# Patient Record
Sex: Female | Born: 2000 | Race: Black or African American | Hispanic: No | Marital: Single | Smoking: Never smoker
Health system: Southern US, Community
[De-identification: ages and names within clinical notes are randomized; demographics above are authoritative.]

## PROBLEM LIST (undated history)

## (undated) DIAGNOSIS — L309 Dermatitis, unspecified: Secondary | ICD-10-CM

## (undated) DIAGNOSIS — I10 Essential (primary) hypertension: Secondary | ICD-10-CM

## (undated) DIAGNOSIS — J45909 Unspecified asthma, uncomplicated: Secondary | ICD-10-CM

## (undated) HISTORY — PX: WISDOM TOOTH EXTRACTION: SHX21

---
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
## 2018-01-31 IMAGING — CR DG CHEST 2V
2 series · 2 of 2 positions shown · non-contrast
Comparison: None.

CLINICAL DATA: Cough, congestion and slight fever with chest pain.

EXAM:
CHEST  2 VIEW

[w chest pa *]
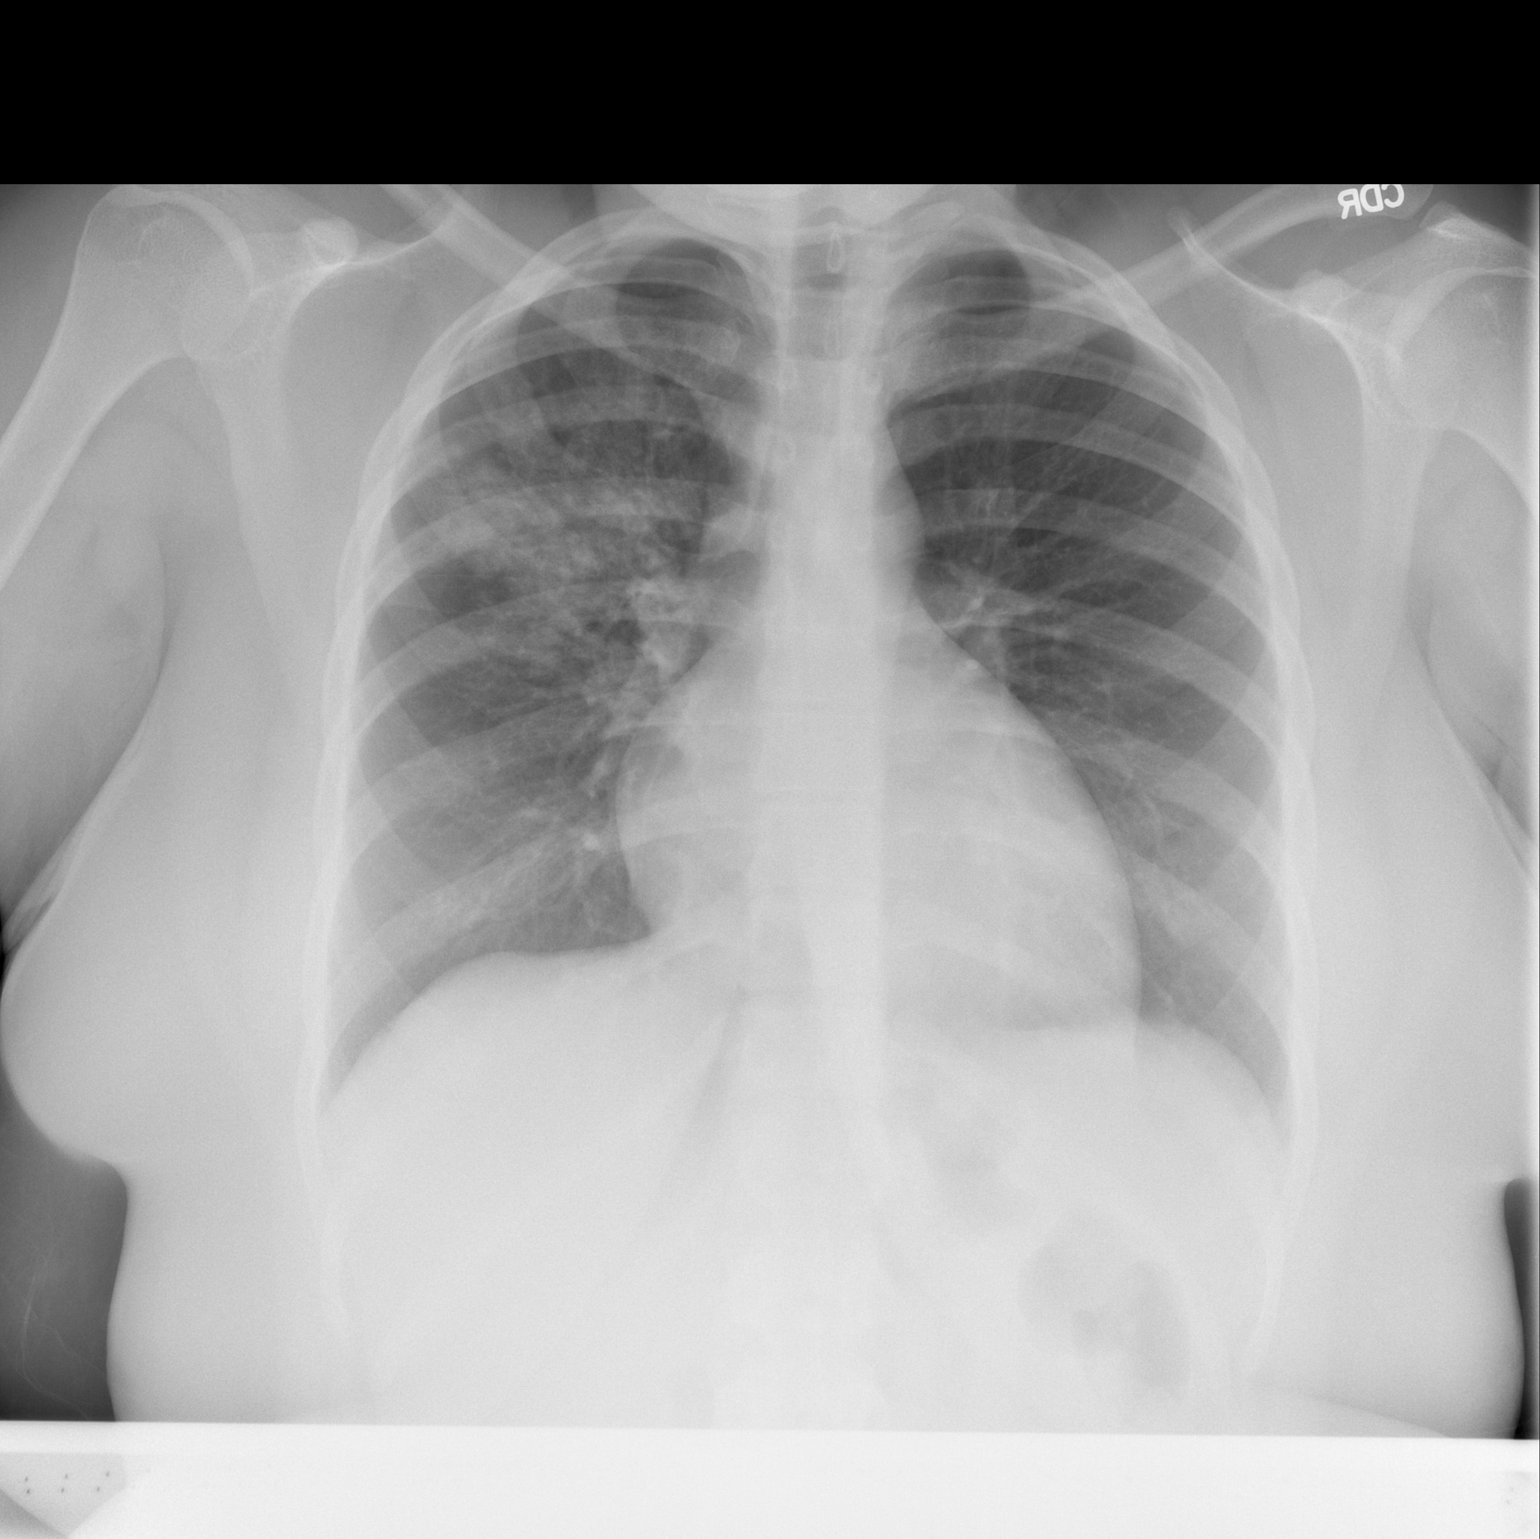

[w chest lat *]
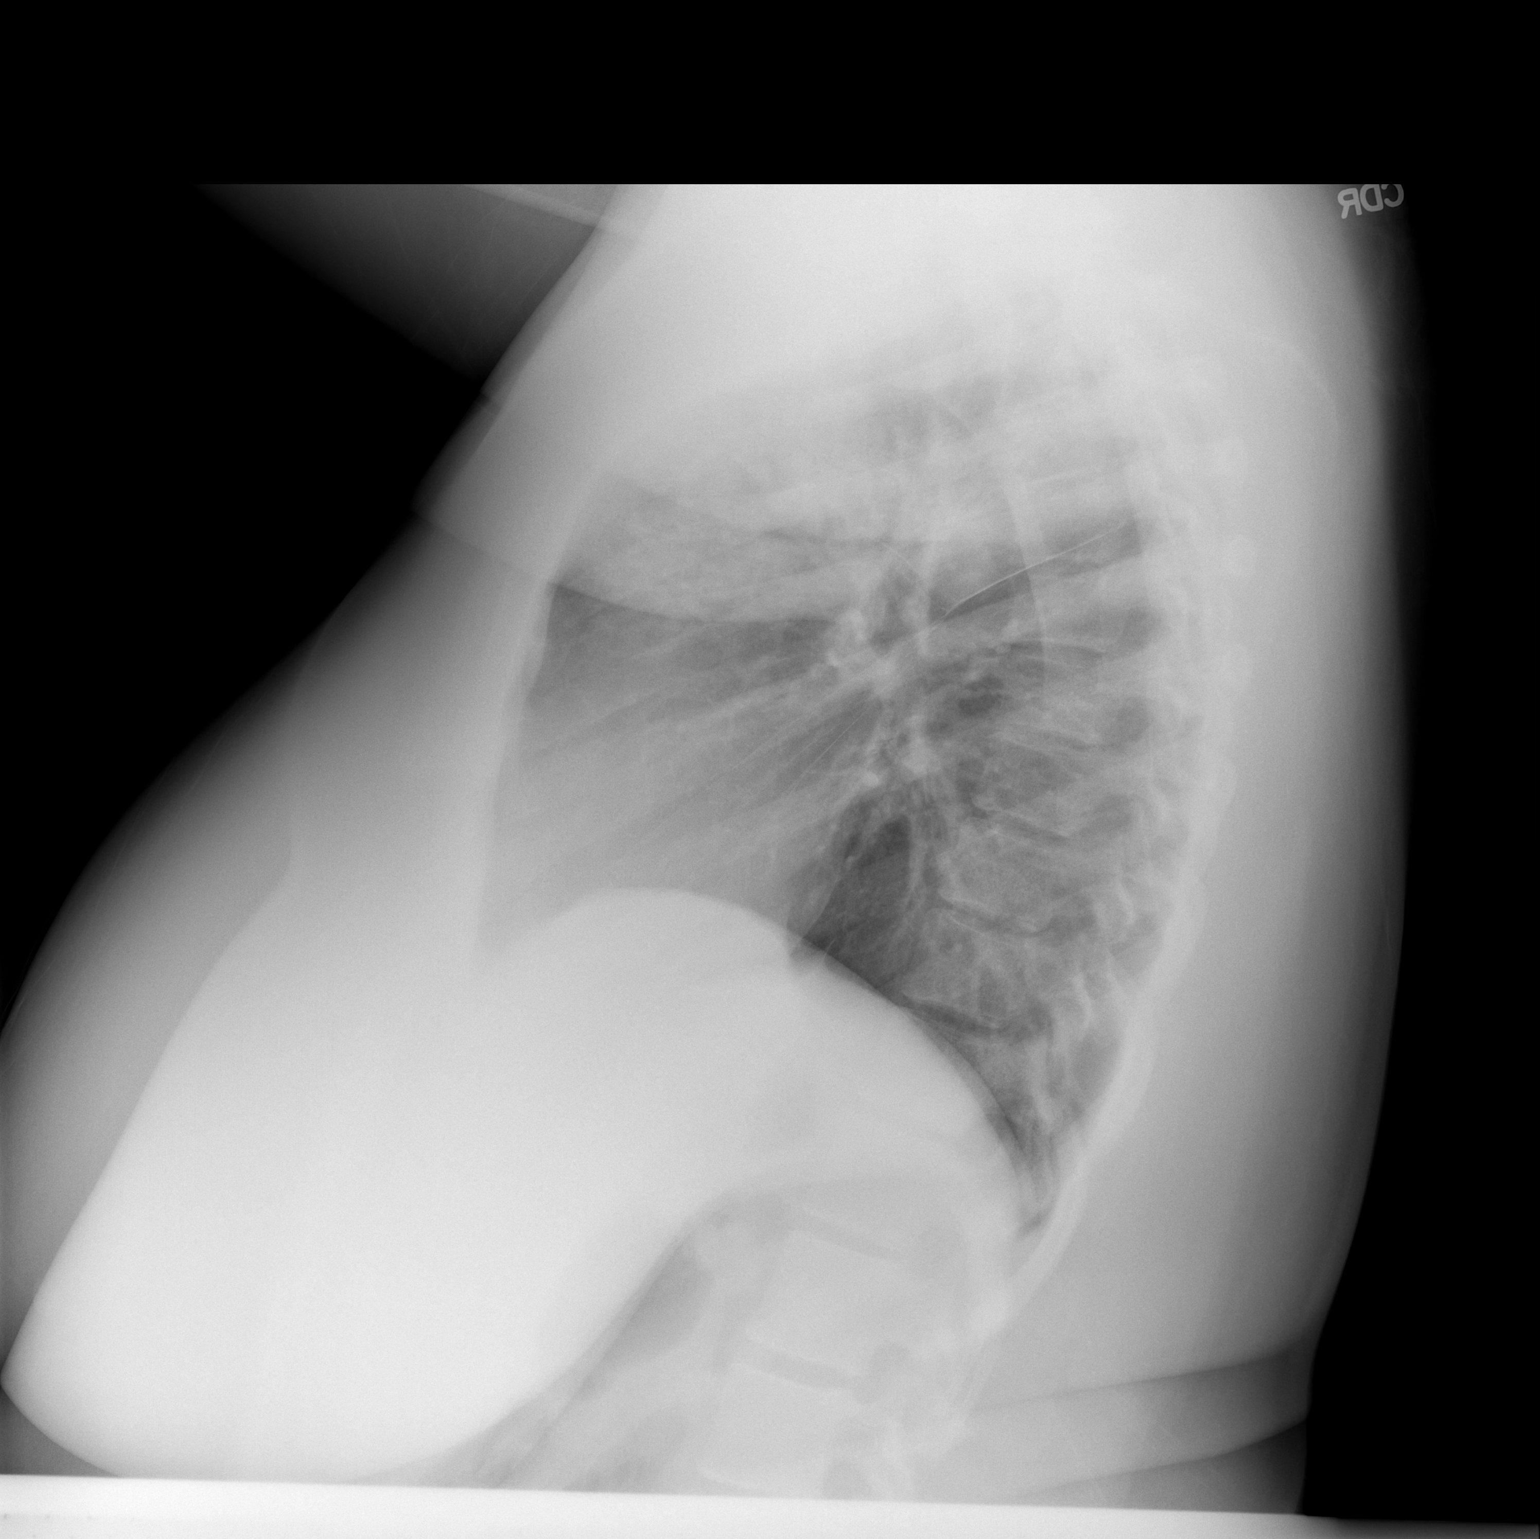

[2 of 2 positions shown; findings below may reference images not displayed]

FINDINGS: Pneumonic consolidation in the right upper lobe is noted. The heart
is top-normal in size. The left lung is clear. No suspicious osseous
lesions. No aortic aneurysm. No effusion or pneumothorax.
IMPRESSION: Right upper lobe pneumonia.

## 2018-02-10 ENCOUNTER — Encounter (HOSPITAL_BASED_OUTPATIENT_CLINIC_OR_DEPARTMENT_OTHER): Payer: Self-pay | Admitting: *Deleted

## 2018-02-10 ENCOUNTER — Other Ambulatory Visit: Payer: Self-pay

## 2018-02-10 ENCOUNTER — Emergency Department (HOSPITAL_BASED_OUTPATIENT_CLINIC_OR_DEPARTMENT_OTHER)
Admission: EM | Admit: 2018-02-10 | Discharge: 2018-02-10 | Disposition: A | Discharge status: Home / Self Care | Payer: Medicaid Other | Attending: Emergency Medicine | Admitting: Emergency Medicine

## 2018-02-10 DIAGNOSIS — J45909 Unspecified asthma, uncomplicated: Secondary | ICD-10-CM | POA: Insufficient documentation

## 2018-02-10 DIAGNOSIS — Z7722 Contact with and (suspected) exposure to environmental tobacco smoke (acute) (chronic): Secondary | ICD-10-CM | POA: Insufficient documentation

## 2018-02-10 DIAGNOSIS — I10 Essential (primary) hypertension: Secondary | ICD-10-CM | POA: Diagnosis not present

## 2018-02-10 DIAGNOSIS — R600 Localized edema: Secondary | ICD-10-CM | POA: Diagnosis not present

## 2018-02-10 DIAGNOSIS — Z91010 Allergy to peanuts: Secondary | ICD-10-CM | POA: Insufficient documentation

## 2018-02-10 LAB — PREGNANCY, URINE: Preg Test, Ur: NEGATIVE

## 2018-02-10 NOTE — ED Provider Notes (Signed)
[facility] EMERGENCY DEPARTMENT Provider Note   CSN: [id number] Arrival date & time: 02/10/18  1130     History   Chief Complaint Chief Complaint  Patient presents with  . Joint Swelling    HPI Cathy Todd is a 17 y.o. female.  Patient presents with complaint of bilateral lower extremity swelling occurring since March of this year.  Patient complains of pain in her ankles and feet when the swelling is worse.  She reports working long hours on her feet and symptoms are worse after she works.  If she has a couple days off or has her feet propped up at night, the symptoms improved.  Patient denies any history of liver, kidney, heart problems.  She does eat a lot of salt in her diet.  No swelling elsewhere.  No fevers, nausea, vomiting.  No change in the urine.  No treatments prior to arrival.     Past Medical History:  Diagnosis Date  . Asthma   . Eczema   . Hypertension     There are no active problems to display for this patient.   History reviewed. No pertinent surgical history.   OB History   None      Home Medications    Prior to Admission medications   Medication Sig Start Date End Date Taking? Authorizing Provider  DESONIDE EX Apply topically.    [provider]    Family History Family History  Problem Relation Age of Onset  . Hypertension Other   . Stroke Other   . Heart disease Other   . Diabetes Other     Social History Social History   Tobacco Use  . Smoking status: Passive Smoke Exposure - Never Smoker  . Smokeless tobacco: Never Used  Substance Use Topics  . Alcohol use: No  . Drug use: No     Allergies   Fish allergy; Peanut-containing drug products; and Shellfish allergy   Review of Systems Review of Systems  Constitutional: Negative for fever.  HENT: Negative for rhinorrhea and sore throat.   Eyes: Negative for redness.  Respiratory: Negative for cough.   Cardiovascular: Positive for leg swelling.  Negative for chest pain.  Gastrointestinal: Negative for abdominal pain, diarrhea, nausea and vomiting.  Genitourinary: Negative for dysuria.  Musculoskeletal: Negative for myalgias.  Skin: Negative for rash.  Neurological: Negative for headaches.     Physical Exam Updated Vital Signs BP 121/73   Pulse (!) 101   Temp 98.1 F (36.7 C) (Oral)   Resp 18   Ht 5\' 4"  (1.626 m)   Wt 127 kg (280 lb)   SpO2 100%   BMI 48.06 kg/m   Physical Exam  Constitutional: She appears well-developed and well-nourished.  HENT:  Head: Normocephalic and atraumatic.  Eyes: Conjunctivae are normal.  Neck: Normal range of motion. Neck supple.  Pulmonary/Chest: No respiratory distress.  Musculoskeletal: She exhibits edema and tenderness.  Patient with symmetric 1+ pitting edema to the ankles and feet bilaterally.  No erythema or redness to suggest cellulitis.  No significant tenderness to suggest DVT.  Neurological: She is alert.  Skin: Skin is warm and dry.  Psychiatric: She has a normal mood and affect.  Nursing note and vitals reviewed.    ED Treatments / Results  Labs (all labs ordered are listed, but only abnormal results are displayed) Labs Reviewed  PREGNANCY, URINE    EKG None  Radiology No results found.  Procedures Procedures (including critical care time)  Medications Ordered  in ED Medications - No data to display   Initial Impression / Assessment and Plan / ED Course  I have reviewed the triage vital signs and the nursing notes.  Pertinent labs & imaging results that were available during my care of the patient were reviewed by me and considered in my medical decision making (see chart for details).     Patient seen and examined.  Patient with a history and exam consistent with dependent edema.  Vital signs reviewed and are as follows: BP 121/73   Pulse (!) 101   Temp 98.1 F (36.7 C) (Oral)   Resp 18   Ht 5\' 4"  (1.626 m)   Wt 127 kg (280 lb)   SpO2 100%    BMI 48.06 kg/m   I have encouraged patient to go to a medical supply store and obtain a pair of fitted compression stockings.  We also discussed decreasing salt level in her diet and elevating legs when possible.  Patient verbalizes understanding agrees with plan.  Final Clinical Impressions(s) / ED Diagnoses   Final diagnoses:  Bilateral lower extremity edema   Patient with bilateral dependent edema.  Low concern for liver issue, heart failure, nephrotic syndrome, DVTs bilaterally at this point.  Do not feel that further work-up is indicated at this point.  Discussed plan as above.  ED Discharge Orders    None       Renne CriglerGeiple, Chesnee Floren, Cordelia Poche-C 02/10/18 1430    Loren RacerYelverton, David, MD 02/10/18 [phone]

## 2018-02-10 NOTE — ED Notes (Signed)
ED Provider at bedside. 

## 2018-02-10 NOTE — ED Triage Notes (Signed)
Ankles have been swelling for 2 months. States she stands for long hours at work.

## 2018-02-10 NOTE — Discharge Instructions (Signed)
Please read and follow all provided instructions.  Your diagnoses today include:  1. Bilateral lower extremity edema    Tests performed today include:  Vital signs. See below for your results today.   Medications prescribed:   None  Take any prescribed medications only as directed.  Home care instructions:  Follow any educational materials contained in this packet.  Please go to a medical supply store and get a pair of fitted compression stockings to wear while you are at work.   Follow-up instructions: Please follow-up with your primary care provider in the next 3 days for further evaluation of your symptoms.   Return instructions:   Please return to the Emergency Department if you experience worsening symptoms.   Please return if you have any other emergent concerns.  Additional Information:  Your vital signs today were: BP 121/73    Pulse (!) 101    Temp 98.1 F (36.7 C) (Oral)    Resp 18    Ht 5\' 4"  (1.626 m)    Wt 127 kg (280 lb)    SpO2 100%    BMI 48.06 kg/m  If your blood pressure (BP) was elevated above 135/85 this visit, please have this repeated by your doctor within one month. --------------

## 2019-04-20 ENCOUNTER — Other Ambulatory Visit: Payer: Self-pay

## 2019-04-20 ENCOUNTER — Encounter (HOSPITAL_BASED_OUTPATIENT_CLINIC_OR_DEPARTMENT_OTHER): Payer: Self-pay | Admitting: Emergency Medicine

## 2019-04-20 ENCOUNTER — Emergency Department (HOSPITAL_BASED_OUTPATIENT_CLINIC_OR_DEPARTMENT_OTHER)
Admission: EM | Admit: 2019-04-20 | Discharge: 2019-04-20 | Disposition: A | Discharge status: Home / Self Care | Payer: Medicaid Other | Attending: Emergency Medicine | Admitting: Emergency Medicine

## 2019-04-20 DIAGNOSIS — N3000 Acute cystitis without hematuria: Secondary | ICD-10-CM | POA: Insufficient documentation

## 2019-04-20 DIAGNOSIS — I10 Essential (primary) hypertension: Secondary | ICD-10-CM | POA: Insufficient documentation

## 2019-04-20 DIAGNOSIS — Z113 Encounter for screening for infections with a predominantly sexual mode of transmission: Secondary | ICD-10-CM | POA: Diagnosis not present

## 2019-04-20 DIAGNOSIS — R1110 Vomiting, unspecified: Secondary | ICD-10-CM | POA: Diagnosis present

## 2019-04-20 DIAGNOSIS — J45909 Unspecified asthma, uncomplicated: Secondary | ICD-10-CM | POA: Diagnosis not present

## 2019-04-20 DIAGNOSIS — R112 Nausea with vomiting, unspecified: Secondary | ICD-10-CM

## 2019-04-20 LAB — URINALYSIS, ROUTINE W REFLEX MICROSCOPIC
Bilirubin Urine: NEGATIVE
Glucose, UA: NEGATIVE mg/dL
Ketones, ur: NEGATIVE mg/dL
Nitrite: NEGATIVE
Protein, ur: 30 mg/dL — AB
Specific Gravity, Urine: >1.03 — ABNORMAL HIGH (ref 1.005–1.030)
pH: 6 (ref 5.0–8.0)

## 2019-04-20 LAB — PREGNANCY, URINE: Preg Test, Ur: NEGATIVE

## 2019-04-20 LAB — URINALYSIS, MICROSCOPIC (REFLEX): WBC, UA: >50 WBC/hpf (ref 0–5)

## 2019-04-20 LAB — WET PREP, GENITAL
Sperm: NONE SEEN
Trich, Wet Prep: NONE SEEN
Yeast Wet Prep HPF POC: NONE SEEN

## 2019-04-20 MED ORDER — CEPHALEXIN 500 MG PO CAPS: 500.0000 mg | ORAL_CAPSULE | Freq: Three times a day (TID) | ORAL | 0 refills | Status: DC

## 2019-04-20 MED FILL — CEPHALEXIN 500 MG CAPSULE: 500 | 5 days supply | Qty: 15 | Fill #0

## 2019-04-20 NOTE — ED Notes (Signed)
ED Provider at bedside. 

## 2019-04-20 NOTE — Discharge Instructions (Addendum)
Begin taking Keflex as prescribed.  We will call you if your cultures indicate you require further treatment or need to take additional action. 

## 2019-04-20 NOTE — ED Provider Notes (Signed)
[place] EMERGENCY DEPARTMENT Provider Note   CSN: [id number] Arrival date & time: 04/20/19  0731     History   Chief Complaint Chief Complaint  Patient presents with  . work note    HPI Cathy Todd is a 18 y.o. female.     Patient is a 18 year old female with history of asthma presenting with complaints of vomiting.  According to the patient she went to work this morning and vomited in the bathroom.  Her boss heard her vomit and told her she must come to the ER to be checked for COVID.  Patient denies any cough, fevers, or chills.  She denies any abdominal pain.    Patient would also like to be checked for STDs while she is here.  She is not having any discharge or vaginal pain, however states that she is concerned the person she is having sex with is promiscuous.  The history is provided by the patient.    Past Medical History:  Diagnosis Date  . Asthma   . Eczema   . Hypertension     There are no active problems to display for this patient.   Past Surgical History:  Procedure Laterality Date  . WISDOM TOOTH EXTRACTION       OB History   No obstetric history on file.      Home Medications    Prior to Admission medications   Not on File    Family History Family History  Problem Relation Age of Onset  . Hypertension Other   . Stroke Other   . Heart disease Other   . Diabetes Other     Social History Social History   Tobacco Use  . Smoking status: Never Smoker  . Smokeless tobacco: Never Used  Substance Use Topics  . Alcohol use: No    Comment: occ  . Drug use: Yes    Types: Marijuana    Comment: Last smoked last night     Allergies   Fish allergy, Peanut-containing drug products, and Shellfish allergy   Review of Systems Review of Systems  All other systems reviewed and are negative.    Physical Exam Updated Vital Signs BP 130/84 (BP Location: Right Arm)   Pulse 71   Temp 98.4 F (36.9 C) (Oral)   Resp 16    Ht 5\' 4"  (1.626 m)   Wt 94.6 kg   LMP 03/24/2019   SpO2 100%   BMI 35.81 kg/m   Physical Exam Vitals signs and nursing note reviewed.  Constitutional:      General: She is not in acute distress.    Appearance: She is well-developed. She is not diaphoretic.  HENT:     Head: Normocephalic and atraumatic.  Neck:     Musculoskeletal: Normal range of motion and neck supple.  Cardiovascular:     Rate and Rhythm: Normal rate and regular rhythm.     Heart sounds: No murmur. No friction rub. No gallop.   Pulmonary:     Effort: Pulmonary effort is normal. No respiratory distress.     Breath sounds: Normal breath sounds. No wheezing.  Abdominal:     General: Bowel sounds are normal. There is no distension.     Palpations: Abdomen is soft.     Tenderness: There is no abdominal tenderness.  Genitourinary:    General: Normal vulva.     Comments: Pelvic exam performed with chaperone present, RN Ilda Basset.  External genitalia is unremarkable in appearance.  There is  a slight whitish discharge present.   Musculoskeletal: Normal range of motion.  Skin:    General: Skin is warm and dry.  Neurological:     Mental Status: She is alert and oriented to person, place, and time.      ED Treatments / Results  Labs (all labs ordered are listed, but only abnormal results are displayed) Labs Reviewed  WET PREP, GENITAL  URINALYSIS, ROUTINE W REFLEX MICROSCOPIC  PREGNANCY, URINE  GC/CHLAMYDIA PROBE AMP ([place] NOT [facility]    EKG None  Radiology No results found.  Procedures Procedures (including critical care time)  Medications Ordered in ED Medications - No data to display   Initial Impression / Assessment and Plan / ED Course  I have reviewed the triage vital signs and the nursing notes.  Pertinent labs & imaging results that were available during my care of the patient were reviewed by me and considered in my medical decision making (see chart for details).  Patient  presenting here with complaints of vomiting.  She was told to come here by her employer because she vomited it at work.  This in no way sounds like COVID.  Her abdomen is benign and she is nontoxic-appearing.  A urine pregnancy test and urinalysis was obtained which shows evidence for UTI.  The pregnancy test is negative.  Patient also requesting STD screening.  A wet prep was obtained showing many white cells.  GC and chlamydia pending.  Patient will be given antibiotics for her UTI and discharged.  If her cultures are positive, she will be notified.  As she is not having any symptoms, I do not feel as though presumptive treatment is indicated.  Final Clinical Impressions(s) / ED Diagnoses   Final diagnoses:  None    ED Discharge Orders    None       Geoffery Lyonselo, Geena Weinhold, MD 04/20/19 [phone]

## 2019-04-20 NOTE — ED Triage Notes (Signed)
Pt vomited at work this morning and her boss heard her in the bathroom vomiting and made her come to the doctor and get a note before she can come back.  Sts she vomited once at home this morning and once at work.  Also intermittent dizziness although she drove to work and drove to ED. Minimal abd discomfort.

## 2019-04-21 LAB — GC/CHLAMYDIA PROBE AMP (~~LOC~~) NOT AT ARMC
Chlamydia: POSITIVE — AB
Neisseria Gonorrhea: NEGATIVE

## 2019-04-22 ENCOUNTER — Telehealth: Payer: Self-pay | Admitting: Medical

## 2019-04-22 DIAGNOSIS — A749 Chlamydial infection, unspecified: Secondary | ICD-10-CM

## 2019-04-22 MED ORDER — AZITHROMYCIN 250 MG PO TABS: 1000.0000 mg | ORAL_TABLET | Freq: Once | ORAL | 0 refills | Status: AC

## 2019-04-22 NOTE — Telephone Encounter (Addendum)
[facility] tested positive for  Chlamydia. Patient was called by RN and allergies and pharmacy confirmed. Rx sent to pharmacy of choice.   Luvenia Redden, PA-C 04/22/2019 3:44 PM       ----- Message from Bjorn Loser, RN sent at 04/22/2019  3:12 PM EDT ----- This patient tested positive for :  Chlamydia  She "is allergic to Peanuts and Shellfish,"  I have informed the patient of her results and confirmed her pharmacy is correct in her chart. Please send Rx.   Thank you,   Bjorn Loser, RN   Results faxed to [facility].

## 2019-05-24 ENCOUNTER — Emergency Department (HOSPITAL_BASED_OUTPATIENT_CLINIC_OR_DEPARTMENT_OTHER)
Admission: EM | Admit: 2019-05-24 | Discharge: 2019-05-24 | Disposition: A | Discharge status: Home / Self Care | Payer: Medicaid Other | Attending: Emergency Medicine | Admitting: Emergency Medicine

## 2019-05-24 ENCOUNTER — Encounter (HOSPITAL_BASED_OUTPATIENT_CLINIC_OR_DEPARTMENT_OTHER): Payer: Self-pay | Admitting: Emergency Medicine

## 2019-05-24 ENCOUNTER — Other Ambulatory Visit: Payer: Self-pay

## 2019-05-24 DIAGNOSIS — J45909 Unspecified asthma, uncomplicated: Secondary | ICD-10-CM | POA: Diagnosis not present

## 2019-05-24 DIAGNOSIS — Z79899 Other long term (current) drug therapy: Secondary | ICD-10-CM | POA: Diagnosis not present

## 2019-05-24 DIAGNOSIS — N760 Acute vaginitis: Secondary | ICD-10-CM | POA: Diagnosis not present

## 2019-05-24 DIAGNOSIS — R102 Pelvic and perineal pain: Secondary | ICD-10-CM | POA: Diagnosis present

## 2019-05-24 DIAGNOSIS — I10 Essential (primary) hypertension: Secondary | ICD-10-CM | POA: Insufficient documentation

## 2019-05-24 LAB — WET PREP, GENITAL
Clue Cells Wet Prep HPF POC: NONE SEEN
Sperm: NONE SEEN
Trich, Wet Prep: NONE SEEN
Yeast Wet Prep HPF POC: NONE SEEN

## 2019-05-24 LAB — URINALYSIS, ROUTINE W REFLEX MICROSCOPIC
Bilirubin Urine: NEGATIVE
Glucose, UA: NEGATIVE mg/dL
Hgb urine dipstick: NEGATIVE
Ketones, ur: NEGATIVE mg/dL
Nitrite: NEGATIVE
Protein, ur: 30 mg/dL — AB
Specific Gravity, Urine: >1.03 — ABNORMAL HIGH (ref 1.005–1.030)
pH: 6 (ref 5.0–8.0)

## 2019-05-24 LAB — PREGNANCY, URINE: Preg Test, Ur: NEGATIVE

## 2019-05-24 LAB — URINALYSIS, MICROSCOPIC (REFLEX): WBC, UA: >50 WBC/hpf (ref 0–5)

## 2019-05-24 NOTE — ED Triage Notes (Signed)
Pt states she is having vaginal irritation since last Thursday.  Some yellow discharge.  No itching.  No fever.  Pt tried hydrocortisone cream but no improvement.  No blisters.

## 2019-05-24 NOTE — ED Provider Notes (Signed)
[facility] EMERGENCY DEPARTMENT Provider Note   CSN: [id number] Arrival date & time: 05/24/19  0802     History   Chief Complaint Chief Complaint  Patient presents with  . Vaginal Pain    HPI Clemmie KrillZakiya Deveney is a 18 y.o. female.     HPI  18 year old female presents with vaginal irritation.  Started about 4 days ago.  She did recently use a scented toilet paper which he thinks might of caused this.  She has eczema so she tried hydrocortisone cream but after trying this it started causing burning.  She stopped it.  There is some yellow discharge.  No bleeding.  She has not had intercourse in a while and does not think she has an STI.  No dysuria or abdominal pain.  It is more irritating than painful.  Past Medical History:  Diagnosis Date  . Asthma   . Eczema   . Hypertension     There are no active problems to display for this patient.   Past Surgical History:  Procedure Laterality Date  . WISDOM TOOTH EXTRACTION       OB History   No obstetric history on file.      Home Medications    Prior to Admission medications   Medication Sig Start Date End Date Taking? Authorizing Provider  cephALEXin (KEFLEX) 500 MG capsule Take 1 capsule (500 mg total) by mouth 3 (three) times daily. 04/20/19   Geoffery Lyonselo, Douglas, MD    Family History Family History  Problem Relation Age of Onset  . Hypertension Other   . Stroke Other   . Heart disease Other   . Diabetes Other     Social History Social History   Tobacco Use  . Smoking status: Never Smoker  . Smokeless tobacco: Never Used  Substance Use Topics  . Alcohol use: No    Comment: occ  . Drug use: Yes    Types: Marijuana    Comment: Last smoked last night     Allergies   Fish allergy, Peanut oil, Peanut-containing drug products, and Shellfish allergy   Review of Systems Review of Systems  Gastrointestinal: Negative for abdominal pain.  Genitourinary: Positive for vaginal discharge and vaginal  pain. Negative for dysuria.  All other systems reviewed and are negative.    Physical Exam Updated Vital Signs BP 140/78   Pulse 70   Temp (!) 97.4 F (36.3 C) (Oral)   Resp 16   Ht 5\' 4"  (1.626 m)   Wt 91.6 kg   LMP 04/24/2019   SpO2 98%   BMI 34.67 kg/m   Physical Exam Vitals signs and nursing note reviewed.  Constitutional:      Appearance: She is well-developed.  HENT:     Head: Normocephalic and atraumatic.     Right Ear: External ear normal.     Left Ear: External ear normal.     Nose: Nose normal.  Eyes:     General:        Right eye: No discharge.        Left eye: No discharge.  Cardiovascular:     Rate and Rhythm: Normal rate and regular rhythm.     Heart sounds: Normal heart sounds.  Pulmonary:     Effort: Pulmonary effort is normal.     Breath sounds: Normal breath sounds.  Abdominal:     General: There is no distension.     Palpations: Abdomen is soft.     Tenderness: There is no  abdominal tenderness.  Genitourinary:    Vagina: Vaginal discharge present.     Comments: Bimanual exam deferred Skin:    General: Skin is warm and dry.  Neurological:     Mental Status: She is alert.  Psychiatric:        Mood and Affect: Mood is not anxious.      ED Treatments / Results  Labs (all labs ordered are listed, but only abnormal results are displayed) Labs Reviewed  WET PREP, GENITAL - Abnormal; Notable for the following components:      Result Value   WBC, Wet Prep HPF POC MANY (*)    All other components within normal limits  URINALYSIS, ROUTINE W REFLEX MICROSCOPIC - Abnormal; Notable for the following components:   APPearance HAZY (*)    Specific Gravity, Urine >1.030 (*)    Protein, ur 30 (*)    Leukocytes,Ua SMALL (*)    All other components within normal limits  URINALYSIS, MICROSCOPIC (REFLEX) - Abnormal; Notable for the following components:   Bacteria, UA MANY (*)    All other components within normal limits  PREGNANCY, URINE   GC/CHLAMYDIA PROBE AMP ([place]) NOT AT [facility]    EKG None  Radiology No results found.  Procedures Procedures (including critical care time)  Medications Ordered in ED Medications - No data to display   Initial Impression / Assessment and Plan / ED Course  I have reviewed the triage vital signs and the nursing notes.  Pertinent labs & imaging results that were available during my care of the patient were reviewed by me and considered in my medical decision making (see chart for details).        My suspicion for STI/infection is low.  She denies any urinary complaints, urine sent by nurse however and is contaminated.  I do not think this represents UTI.  Otherwise, no clear infection such as yeast, trichomonas, clue cells.  Likely inflammatory/allergic.  If she had not already tried steroids I would probably prescribe these but given the adverse reaction to the hydrocortisone, will recommend good hygiene and sits baths.  Follow-up with OB/GYN.  Final Clinical Impressions(s) / ED Diagnoses   Final diagnoses:  Acute vaginitis    ED Discharge Orders    None       Sherwood Gambler, MD 05/24/19 1010

## 2019-05-25 LAB — GC/CHLAMYDIA PROBE AMP (~~LOC~~) NOT AT ARMC
Chlamydia: NEGATIVE
Neisseria Gonorrhea: NEGATIVE

## 2019-07-11 ENCOUNTER — Emergency Department (HOSPITAL_BASED_OUTPATIENT_CLINIC_OR_DEPARTMENT_OTHER)
Admission: EM | Admit: 2019-07-11 | Discharge: 2019-07-12 | Disposition: A | Discharge status: Home / Self Care | Payer: Medicaid Other | Attending: Emergency Medicine | Admitting: Emergency Medicine

## 2019-07-11 ENCOUNTER — Encounter (HOSPITAL_BASED_OUTPATIENT_CLINIC_OR_DEPARTMENT_OTHER): Payer: Self-pay | Admitting: Emergency Medicine

## 2019-07-11 ENCOUNTER — Other Ambulatory Visit: Payer: Self-pay

## 2019-07-11 DIAGNOSIS — Z79899 Other long term (current) drug therapy: Secondary | ICD-10-CM | POA: Insufficient documentation

## 2019-07-11 DIAGNOSIS — Z91010 Allergy to peanuts: Secondary | ICD-10-CM | POA: Insufficient documentation

## 2019-07-11 DIAGNOSIS — Z3201 Encounter for pregnancy test, result positive: Secondary | ICD-10-CM | POA: Diagnosis not present

## 2019-07-11 DIAGNOSIS — O200 Threatened abortion: Secondary | ICD-10-CM | POA: Insufficient documentation

## 2019-07-11 DIAGNOSIS — J45909 Unspecified asthma, uncomplicated: Secondary | ICD-10-CM | POA: Diagnosis not present

## 2019-07-11 DIAGNOSIS — I10 Essential (primary) hypertension: Secondary | ICD-10-CM | POA: Insufficient documentation

## 2019-07-11 DIAGNOSIS — N939 Abnormal uterine and vaginal bleeding, unspecified: Secondary | ICD-10-CM | POA: Diagnosis present

## 2019-07-11 LAB — PREGNANCY, URINE: Preg Test, Ur: POSITIVE — AB

## 2019-07-11 LAB — URINALYSIS, ROUTINE W REFLEX MICROSCOPIC
Bilirubin Urine: NEGATIVE
Glucose, UA: NEGATIVE mg/dL
Ketones, ur: >80 mg/dL — AB
Nitrite: NEGATIVE
Protein, ur: 30 mg/dL — AB
Specific Gravity, Urine: >1.03 — ABNORMAL HIGH (ref 1.005–1.030)
pH: 6 (ref 5.0–8.0)

## 2019-07-11 LAB — URINALYSIS, MICROSCOPIC (REFLEX)

## 2019-07-11 MED ORDER — SODIUM CHLORIDE 0.9 % IV BOLUS
1000.0000 mL | Freq: Once | INTRAVENOUS | Status: AC
  Administered 2019-07-11: 1000 mL via INTRAVENOUS

## 2019-07-11 NOTE — ED Notes (Signed)
Called lab to add-on urinalysis °

## 2019-07-11 NOTE — ED Triage Notes (Signed)
Pt requesting pregnancy test, co/ lower abdominal cramping and lower back pain, and nausea. Reports mild spotting at this time.

## 2019-07-11 NOTE — ED Provider Notes (Signed)
[place] EMERGENCY DEPARTMENT Provider Note   CSN: [id number] Arrival date & time: 07/11/19  2144     History   Chief Complaint Chief Complaint  Patient presents with  . Possible Pregnancy    HPI Cathy Todd is a 18 y.o. female.     HPI  This is a 18 year old female with a history of hypertension and asthma who presents with possible pregnancy.  Patient reports that she missed her period which was due October 25.  On Friday she noted some light spotting.  She has not had to use a pad but only notices when she wipes.  She also reports some back pain.  She has developed some nausea.  No vomiting.  She is never been pregnant before.  Denies any abdominal pain or lateralizing pain.  Denies any dysuria or hematuria.  Past Medical History:  Diagnosis Date  . Asthma   . Eczema   . Hypertension     There are no active problems to display for this patient.   Past Surgical History:  Procedure Laterality Date  . WISDOM TOOTH EXTRACTION       OB History   No obstetric history on file.      Home Medications    Prior to Admission medications   Medication Sig Start Date End Date Taking? Authorizing Provider  cephALEXin (KEFLEX) 500 MG capsule Take 1 capsule (500 mg total) by mouth 3 (three) times daily. 04/20/19   Veryl Speak, MD    Family History Family History  Problem Relation Age of Onset  . Hypertension Other   . Stroke Other   . Heart disease Other   . Diabetes Other     Social History Social History   Tobacco Use  . Smoking status: Never Smoker  . Smokeless tobacco: Never Used  Substance Use Topics  . Alcohol use: Yes    Comment: occ  . Drug use: Yes    Types: Marijuana    Comment: Last smoked last night     Allergies   Fish allergy, Peanut oil, Peanut-containing drug products, and Shellfish allergy   Review of Systems Review of Systems  Constitutional: Negative for fever.  Gastrointestinal: Positive for nausea. Negative for  abdominal pain and vomiting.  Genitourinary: Positive for vaginal bleeding. Negative for dysuria and hematuria.  Musculoskeletal: Positive for back pain.  All other systems reviewed and are negative.    Physical Exam Updated Vital Signs BP (!) 150/99 (BP Location: Left Arm)   Temp 98.4 F (36.9 C) (Oral)   Resp 18   Ht 1.626 m (5\' 4" )   Wt 83.9 kg   LMP 06/07/2019 (Approximate)   SpO2 97%   BMI 31.76 kg/m   Physical Exam Vitals signs and nursing note reviewed.  Constitutional:      Appearance: She is well-developed.     Comments: Overweight  HENT:     Head: Normocephalic and atraumatic.     Mouth/Throat:     Mouth: Mucous membranes are moist.  Eyes:     Pupils: Pupils are equal, round, and reactive to light.  Neck:     Musculoskeletal: Neck supple.  Cardiovascular:     Rate and Rhythm: Normal rate and regular rhythm.     Heart sounds: Normal heart sounds.  Pulmonary:     Effort: Pulmonary effort is normal. No respiratory distress.     Breath sounds: No wheezing.  Abdominal:     General: Bowel sounds are normal.     Palpations: Abdomen  is soft.     Tenderness: There is no abdominal tenderness. There is no guarding or rebound.  Genitourinary:    Comments: Normal external vaginal exam, moderate amount of dark red blood in the vaginal vault, cervical os closed, no adnexal tenderness or masses Skin:    General: Skin is warm and dry.  Neurological:     Mental Status: She is alert and oriented to person, place, and time.  Psychiatric:        Mood and Affect: Mood normal.      ED Treatments / Results  Labs (all labs ordered are listed, but only abnormal results are displayed) Labs Reviewed  PREGNANCY, URINE - Abnormal; Notable for the following components:      Result Value   Preg Test, Ur POSITIVE (*)    All other components within normal limits  URINALYSIS, ROUTINE W REFLEX MICROSCOPIC - Abnormal; Notable for the following components:   Color, Urine ORANGE  (*)    APPearance CLOUDY (*)    Specific Gravity, Urine >1.030 (*)    Hgb urine dipstick LARGE (*)    Ketones, ur >80 (*)    Protein, ur 30 (*)    Leukocytes,Ua TRACE (*)    All other components within normal limits  HCG, QUANTITATIVE, PREGNANCY - Abnormal; Notable for the following components:   hCG, Beta Chain, Quant, S 362 (*)    All other components within normal limits  URINALYSIS, MICROSCOPIC (REFLEX) - Abnormal; Notable for the following components:   Bacteria, UA MANY (*)    All other components within normal limits  WET PREP, GENITAL  ABO/RH  GC/CHLAMYDIA PROBE AMP ([place]) NOT AT [facility]    EKG None  Radiology No results found.  Procedures Procedures (including critical care time)  Medications Ordered in ED Medications  sodium chloride 0.9 % bolus 1,000 mL (1,000 mLs Intravenous New Bag/Given 07/11/19 2359)     Initial Impression / Assessment and Plan / ED Course  I have reviewed the triage vital signs and the nursing notes.  Pertinent labs & imaging results that were available during my care of the patient were reviewed by me and considered in my medical decision making (see chart for details).        Patient presents with bleeding and late menstrual period.  She is nontoxic-appearing vital signs are reassuring.  Patient has a nontender abdomen.  Mostly complaining of back pain and nausea.  Reports that she was due to have a.  On October 25.  Unsure of when her last period was.  Urine pregnancy test is positive.  Beta-hCG 362.  She is Rh+ and not a candidate for RhoGam.  Her abdominal and GU exams are benign.  I discussed the results with the patient.  I advised her that at this time we will treat this as a threatened miscarriage.  Given that her abdominal exam is benign and her beta-hCG is 362, do not feel ultrasound imaging would be helpful at this time and I have a lower suspicious for ectopic.  However, I stressed with her that she needs repeat beta-hCG in 2  to 3 days to evaluate the viability of the pregnancy.  She is to monitor her bleeding during that time.  If she develops any new symptoms or specifically abdominal pain, she needs to proceed directly to [facility] for evaluation.  After history, exam, and medical workup I feel the patient has been appropriately medically screened and is safe for discharge home. Pertinent diagnoses were  discussed with the patient. Patient was given return precautions.   Final Clinical Impressions(s) / ED Diagnoses   Final diagnoses:  Threatened miscarriage    ED Discharge Orders    None       Horton, Mayer Maskerourtney F, MD 07/12/19 [phone]

## 2019-07-11 NOTE — ED Notes (Signed)
ED Provider at bedside. 

## 2019-07-12 LAB — ABO/RH: ABO/RH(D): B POS

## 2019-07-12 LAB — WET PREP, GENITAL
Sperm: NONE SEEN
Trich, Wet Prep: NONE SEEN
Yeast Wet Prep HPF POC: NONE SEEN

## 2019-07-12 LAB — HCG, QUANTITATIVE, PREGNANCY: hCG, Beta Chain, Quant, S: 362 m[IU]/mL — ABNORMAL HIGH (ref <5)

## 2019-07-12 NOTE — Discharge Instructions (Addendum)
You were seen today for bleeding during early pregnancy.  This could represent a early miscarriage or normal pregnancy.  Sometimes pregnancies are outside the uterus and are called ectopic pregnancies.  If you develop pain, you need to be reevaluated immediately with ultrasound imaging.  Otherwise, follow-up for recheck of your blood pregnancy levels in 2 to 3 days.

## 2019-07-13 LAB — GC/CHLAMYDIA PROBE AMP (~~LOC~~) NOT AT ARMC
Chlamydia: NEGATIVE
Neisseria Gonorrhea: NEGATIVE

## 2019-07-15 ENCOUNTER — Encounter (HOSPITAL_BASED_OUTPATIENT_CLINIC_OR_DEPARTMENT_OTHER): Payer: Self-pay

## 2019-07-15 ENCOUNTER — Emergency Department (HOSPITAL_BASED_OUTPATIENT_CLINIC_OR_DEPARTMENT_OTHER): Payer: Medicaid Other

## 2019-07-15 ENCOUNTER — Other Ambulatory Visit: Payer: Self-pay

## 2019-07-15 ENCOUNTER — Emergency Department (HOSPITAL_BASED_OUTPATIENT_CLINIC_OR_DEPARTMENT_OTHER)
Admission: EM | Admit: 2019-07-15 | Discharge: 2019-07-15 | Disposition: A | Discharge status: Home / Self Care | Payer: Medicaid Other | Attending: Emergency Medicine | Admitting: Emergency Medicine

## 2019-07-15 DIAGNOSIS — I10 Essential (primary) hypertension: Secondary | ICD-10-CM | POA: Insufficient documentation

## 2019-07-15 DIAGNOSIS — Z3A00 Weeks of gestation of pregnancy not specified: Secondary | ICD-10-CM | POA: Insufficient documentation

## 2019-07-15 DIAGNOSIS — F1210 Cannabis abuse, uncomplicated: Secondary | ICD-10-CM | POA: Diagnosis not present

## 2019-07-15 DIAGNOSIS — O26851 Spotting complicating pregnancy, first trimester: Secondary | ICD-10-CM | POA: Diagnosis present

## 2019-07-15 DIAGNOSIS — O200 Threatened abortion: Secondary | ICD-10-CM | POA: Insufficient documentation

## 2019-07-15 DIAGNOSIS — O209 Hemorrhage in early pregnancy, unspecified: Secondary | ICD-10-CM

## 2019-07-15 LAB — COMPREHENSIVE METABOLIC PANEL
ALT: 11 U/L (ref 0–44)
AST: 13 U/L — ABNORMAL LOW (ref 15–41)
Albumin: 3.5 g/dL (ref 3.5–5.0)
Alkaline Phosphatase: 63 U/L (ref 38–126)
Anion gap: 8 (ref 5–15)
BUN: 12 mg/dL (ref 6–20)
CO2: 23 mmol/L (ref 22–32)
Calcium: 8.8 mg/dL — ABNORMAL LOW (ref 8.9–10.3)
Chloride: 108 mmol/L (ref 98–111)
Creatinine, Ser: 1.01 mg/dL — ABNORMAL HIGH (ref 0.44–1.00)
GFR calc Af Amer: >60 mL/min (ref >60)
GFR calc non Af Amer: >60 mL/min (ref >60)
Glucose, Bld: 129 mg/dL — ABNORMAL HIGH (ref 70–99)
Potassium: 3.8 mmol/L (ref 3.5–5.1)
Sodium: 139 mmol/L (ref 135–145)
Total Bilirubin: 0.4 mg/dL (ref 0.3–1.2)
Total Protein: 7.1 g/dL (ref 6.5–8.1)

## 2019-07-15 LAB — CBC WITH DIFFERENTIAL/PLATELET
Abs Immature Granulocytes: 0.02 10*3/uL (ref 0.00–0.07)
Basophils Absolute: 0 10*3/uL (ref 0.0–0.1)
Basophils Relative: 0 %
Eosinophils Absolute: 0.5 10*3/uL (ref 0.0–0.5)
Eosinophils Relative: 4 %
HCT: 38 % (ref 36.0–46.0)
Hemoglobin: 12.2 g/dL (ref 12.0–15.0)
Immature Granulocytes: 0 %
Lymphocytes Relative: 24 %
Lymphs Abs: 2.8 10*3/uL (ref 0.7–4.0)
MCH: 28.1 pg (ref 26.0–34.0)
MCHC: 32.1 g/dL (ref 30.0–36.0)
MCV: 87.6 fL (ref 80.0–100.0)
Monocytes Absolute: 0.8 10*3/uL (ref 0.1–1.0)
Monocytes Relative: 7 %
Neutro Abs: 7.7 10*3/uL (ref 1.7–7.7)
Neutrophils Relative %: 65 %
Platelets: 414 10*3/uL — ABNORMAL HIGH (ref 150–400)
RBC: 4.34 MIL/uL (ref 3.87–5.11)
RDW: 13.5 % (ref 11.5–15.5)
WBC: 11.9 10*3/uL — ABNORMAL HIGH (ref 4.0–10.5)
nRBC: 0 % (ref 0.0–0.2)

## 2019-07-15 LAB — HCG, QUANTITATIVE, PREGNANCY: hCG, Beta Chain, Quant, S: 40 m[IU]/mL — ABNORMAL HIGH (ref <5)

## 2019-07-15 NOTE — ED Notes (Signed)
Patient transported to Ultrasound 

## 2019-07-15 NOTE — ED Triage Notes (Signed)
Pt c/o vaginal bleeding x today-blood on tissue and in toilet after voiding-zero pad count-was seen here for same 11/2 and notified she was pregnant-NAD-steady gait

## 2019-07-15 NOTE — ED Provider Notes (Signed)
[place] EMERGENCY DEPARTMENT Provider Note   CSN: [id number] Arrival date & time: 07/15/19  1920     History   Chief Complaint Chief Complaint  Patient presents with  . Vaginal Bleeding    HPI Cathy Todd is a 18 y.o. female presenting to the emergency department for subsequent visit regarding vaginal bleeding.  Patient was evaluated on 07/12/2019 with vaginal bleeding and positive pregnancy.  She was discharged with instructions to follow-up in 2 to 3 days for repeat hCG level.  She was also instructed of strict return precautions.  She states her bleeding stopped on Monday, however today she noticed spotting which then became heavy around 5 PM with severe lower abdominal pain.  She states this pregnancy was not planned, however her significant other and herself were not taking any precaution to prevent a pregnancy and welcomed it.     The history is provided by the patient and medical records.    Past Medical History:  Diagnosis Date  . Asthma   . Eczema   . Hypertension     There are no active problems to display for this patient.   Past Surgical History:  Procedure Laterality Date  . WISDOM TOOTH EXTRACTION       OB History    Gravida  1   Para      Term      Preterm      AB      Living        SAB      TAB      Ectopic      Multiple      Live Births               Home Medications    Prior to Admission medications   Medication Sig Start Date End Date Taking? Authorizing Provider  cephALEXin (KEFLEX) 500 MG capsule Take 1 capsule (500 mg total) by mouth 3 (three) times daily. 04/20/19   Veryl Speak, MD    Family History Family History  Problem Relation Age of Onset  . Hypertension Other   . Stroke Other   . Heart disease Other   . Diabetes Other     Social History Social History   Tobacco Use  . Smoking status: Never Smoker  . Smokeless tobacco: Never Used  Substance Use Topics  . Alcohol use: Yes   Comment: occ  . Drug use: Yes    Types: Marijuana     Allergies   Fish allergy, Peanut oil, Peanut-containing drug products, and Shellfish allergy   Review of Systems Review of Systems  All other systems reviewed and are negative.    Physical Exam Updated Vital Signs BP (!) 139/94 (BP Location: Left Arm)   Pulse 80   Temp 98.1 F (36.7 C) (Oral)   Resp 20   Ht 5\' 4"  (1.626 m)   Wt 89.8 kg   LMP 06/07/2019 (Approximate)   SpO2 100%   BMI 33.99 kg/m   Physical Exam Vitals signs and nursing note reviewed.  Constitutional:      General: She is not in acute distress.    Appearance: She is well-developed. She is not ill-appearing.  HENT:     Head: Normocephalic and atraumatic.  Eyes:     Conjunctiva/sclera: Conjunctivae normal.  Cardiovascular:     Rate and Rhythm: Normal rate and regular rhythm.  Pulmonary:     Effort: Pulmonary effort is normal. No respiratory distress.     Breath sounds:  Normal breath sounds.  Abdominal:     General: Bowel sounds are normal.     Palpations: Abdomen is soft.     Tenderness: There is abdominal tenderness in the right lower quadrant, suprapubic area and left lower quadrant. There is no guarding or rebound.     Comments: Diffuse lower abdominal tenderness, worse in the left  Skin:    General: Skin is warm.  Neurological:     Mental Status: She is alert.  Psychiatric:        Behavior: Behavior normal.      ED Treatments / Results  Labs (all labs ordered are listed, but only abnormal results are displayed) Labs Reviewed  HCG, QUANTITATIVE, PREGNANCY - Abnormal; Notable for the following components:      Result Value   hCG, Beta Chain, Quant, S 40 (*)    All other components within normal limits  COMPREHENSIVE METABOLIC PANEL - Abnormal; Notable for the following components:   Glucose, Bld 129 (*)    Creatinine, Ser 1.01 (*)    Calcium 8.8 (*)    AST 13 (*)    All other components within normal limits  CBC WITH  DIFFERENTIAL/PLATELET - Abnormal; Notable for the following components:   WBC 11.9 (*)    Platelets 414 (*)    All other components within normal limits    EKG None  Radiology [place] Ob Less Than 14 Weeks With Ob Transvaginal  Result Date: 07/15/2019 CLINICAL DATA:  Vaginal bleeding, pelvic pain EXAM: OBSTETRIC <14 WK US AND TRANSVAGINAL OB US TECHNIQUE: Both transabdominal and transvaginal ultrasound examinations were performed for complete evaluation of the gestation as well as the maternal uterus, adnexal regions, and pelvic cul-de-sac. Transvaginal technique was performed to assess early pregnancy. COMPARISON:  None. FINDINGS: Intrauterine gestational sac: None Yolk sac:  Not visualized Embryo:  Not visualized Cardiac Activity: Heart Rate:   bpm MSD:   mm    w     d CRL:    mm    w    d                  US EDC: Subchorionic hemorrhage:  None visualized. Maternal uterus/adnexae: No adnexal mass or free fluid. Small amount of fluid in the lower endometrial canal. IMPRESSION: No intrauterine pregnancy visualized. Differential considerations would include early intrauterine pregnancy too early to visualize, spontaneous abortion, or occult ectopic pregnancy. Recommend close clinical followup and serial quantitative beta HCGs and ultrasounds. Electronically Signed   By: Charlett NoseKevin  Dover M.D.   On: 07/15/2019 21:35    Procedures Procedures (including critical care time)  Medications Ordered in ED Medications - No data to display   Initial Impression / Assessment and Plan / ED Course  I have reviewed the triage vital signs and the nursing notes.  Pertinent labs & imaging results that were available during my care of the patient were reviewed by me and considered in my medical decision making (see chart for details).        Patient presenting with recurrent vaginal bleeding after being diagnosed with threatened miscarriage during ED visit 3 days ago.  Bleeding stopped after ED visit, however  returned today with lower abdominal pain.  She has a an patient appointment tomorrow with OB/GYN.  On exam she is well-appearing, no distress noted.  Hemodynamically stable.  Abdomen has generalized lower abdominal tenderness.  Proceeded with pelvic ultrasound to rule out ectopic given patient's new onset of abdominal pain.  Patient is Rh+ per  recent ED visit and not requiring RhoGam.  hCG is trending down today at 40, from 362 four days ago.  Hemoglobin is stable.  Pelvic ultrasound does not visualize any intrauterine products of conception nor any abnormal findings in the adnexa.  Discussed with patient likely miscarriage though also discussed importance of close outpatient follow-up to continue to trend hCG. Discussed also possibility of occult ectopic, pt verbalized understanding and importance of follow up. She is in no distress. All questions answered to the best of my ability. Safe for discharge.  Discussed results, findings, treatment and follow up. Patient advised of return precautions. Patient verbalized understanding and agreed with plan.  Final Clinical Impressions(s) / ED Diagnoses   Final diagnoses:  Threatened miscarriage    ED Discharge Orders    None       Clemencia Helzer, [place] N, PA-C 07/15/19 2236    Sabas Sous, MD 07/15/19 [phone]

## 2019-07-15 NOTE — Discharge Instructions (Addendum)
Attend your appointment tomorrow with the OB/GYN specialists.  It is recommended you continue to monitor your hCG hormone level. Your ultrasound is most consistent with probable miscarriage, however it is important to have close follow-up with your gynecologist to ensure there are no complications and that there is not a missed pregnancy implanted in the wrong location of your pelvic organs. If you have any new or worsening symptoms, please report to the [facility], at the [place].

## 2019-07-16 ENCOUNTER — Other Ambulatory Visit: Payer: Medicaid Other

## 2019-07-17 ENCOUNTER — Other Ambulatory Visit: Payer: Self-pay

## 2019-07-17 ENCOUNTER — Emergency Department (HOSPITAL_BASED_OUTPATIENT_CLINIC_OR_DEPARTMENT_OTHER)
Admission: EM | Admit: 2019-07-17 | Discharge: 2019-07-17 | Disposition: A | Discharge status: Home / Self Care | Payer: Medicaid Other | Attending: Emergency Medicine | Admitting: Emergency Medicine

## 2019-07-17 ENCOUNTER — Encounter (HOSPITAL_BASED_OUTPATIENT_CLINIC_OR_DEPARTMENT_OTHER): Payer: Self-pay | Admitting: *Deleted

## 2019-07-17 DIAGNOSIS — Z91010 Allergy to peanuts: Secondary | ICD-10-CM | POA: Diagnosis not present

## 2019-07-17 DIAGNOSIS — J45909 Unspecified asthma, uncomplicated: Secondary | ICD-10-CM | POA: Insufficient documentation

## 2019-07-17 DIAGNOSIS — N390 Urinary tract infection, site not specified: Secondary | ICD-10-CM | POA: Diagnosis not present

## 2019-07-17 DIAGNOSIS — E876 Hypokalemia: Secondary | ICD-10-CM | POA: Insufficient documentation

## 2019-07-17 DIAGNOSIS — R1030 Lower abdominal pain, unspecified: Secondary | ICD-10-CM | POA: Diagnosis present

## 2019-07-17 DIAGNOSIS — Z91013 Allergy to seafood: Secondary | ICD-10-CM | POA: Insufficient documentation

## 2019-07-17 DIAGNOSIS — I10 Essential (primary) hypertension: Secondary | ICD-10-CM | POA: Insufficient documentation

## 2019-07-17 LAB — BASIC METABOLIC PANEL
Anion gap: 8 (ref 5–15)
BUN: 9 mg/dL (ref 6–20)
CO2: 23 mmol/L (ref 22–32)
Calcium: 8.6 mg/dL — ABNORMAL LOW (ref 8.9–10.3)
Chloride: 106 mmol/L (ref 98–111)
Creatinine, Ser: 0.87 mg/dL (ref 0.44–1.00)
GFR calc Af Amer: >60 mL/min (ref >60)
GFR calc non Af Amer: >60 mL/min (ref >60)
Glucose, Bld: 94 mg/dL (ref 70–99)
Potassium: 3.2 mmol/L — ABNORMAL LOW (ref 3.5–5.1)
Sodium: 137 mmol/L (ref 135–145)

## 2019-07-17 LAB — CBC WITH DIFFERENTIAL/PLATELET
Abs Immature Granulocytes: 0 10*3/uL (ref 0.00–0.07)
Basophils Absolute: 0 10*3/uL (ref 0.0–0.1)
Basophils Relative: 1 %
Eosinophils Absolute: 0.1 10*3/uL (ref 0.0–0.5)
Eosinophils Relative: 3 %
HCT: 40 % (ref 36.0–46.0)
Hemoglobin: 12.8 g/dL (ref 12.0–15.0)
Immature Granulocytes: 0 %
Lymphocytes Relative: 43 %
Lymphs Abs: 2.3 10*3/uL (ref 0.7–4.0)
MCH: 27.8 pg (ref 26.0–34.0)
MCHC: 32 g/dL (ref 30.0–36.0)
MCV: 86.8 fL (ref 80.0–100.0)
Monocytes Absolute: 0.4 10*3/uL (ref 0.1–1.0)
Monocytes Relative: 7 %
Neutro Abs: 2.5 10*3/uL (ref 1.7–7.7)
Neutrophils Relative %: 46 %
Platelets: 454 10*3/uL — ABNORMAL HIGH (ref 150–400)
RBC: 4.61 MIL/uL (ref 3.87–5.11)
RDW: 13.5 % (ref 11.5–15.5)
WBC: 5.3 10*3/uL (ref 4.0–10.5)
nRBC: 0 % (ref 0.0–0.2)

## 2019-07-17 LAB — URINALYSIS, MICROSCOPIC (REFLEX): RBC / HPF: >50 RBC/hpf (ref 0–5)

## 2019-07-17 LAB — URINALYSIS, ROUTINE W REFLEX MICROSCOPIC
Glucose, UA: NEGATIVE mg/dL
Ketones, ur: NEGATIVE mg/dL
Nitrite: NEGATIVE
Protein, ur: 100 mg/dL — AB
Specific Gravity, Urine: 1.025 (ref 1.005–1.030)
pH: 6.5 (ref 5.0–8.0)

## 2019-07-17 LAB — HCG, QUANTITATIVE, PREGNANCY: hCG, Beta Chain, Quant, S: 22 m[IU]/mL — ABNORMAL HIGH (ref <5)

## 2019-07-17 MED ORDER — CEPHALEXIN 500 MG PO CAPS: 500.0000 mg | ORAL_CAPSULE | Freq: Two times a day (BID) | ORAL | 0 refills | Status: AC

## 2019-07-17 MED ORDER — DICYCLOMINE HCL 20 MG PO TABS: 20.0000 mg | ORAL_TABLET | Freq: Two times a day (BID) | ORAL | 0 refills | Status: DC

## 2019-07-17 MED ORDER — DICYCLOMINE HCL 10 MG/ML IM SOLN
20.0000 mg | Freq: Once | INTRAMUSCULAR | Status: AC
  Administered 2019-07-17: 20 mg via INTRAMUSCULAR
  Filled 2019-07-17: qty 2

## 2019-07-17 MED ORDER — POTASSIUM CHLORIDE ER 10 MEQ PO TBCR: 20.0000 meq | EXTENDED_RELEASE_TABLET | Freq: Every day | ORAL | 0 refills | Status: DC

## 2019-07-17 MED ORDER — POTASSIUM CHLORIDE CRYS ER 20 MEQ PO TBCR
40.0000 meq | EXTENDED_RELEASE_TABLET | Freq: Once | ORAL | Status: AC
  Administered 2019-07-17: 40 meq via ORAL
  Filled 2019-07-17: qty 2

## 2019-07-17 NOTE — Discharge Instructions (Addendum)
Take the antibiotics as directed. Return to the ED if you start to experience worsening symptoms, develop a fever, worsening vaginal bleeding, lightheadedness, shortness of breath or chest pain.

## 2019-07-17 NOTE — ED Notes (Signed)
ED Provider at bedside. 

## 2019-07-17 NOTE — ED Provider Notes (Signed)
[facility] EMERGENCY DEPARTMENT Provider Note   CSN: [id number] Arrival date & time: 07/17/19  0844     History   Chief Complaint Chief Complaint  Patient presents with  . Abdominal Pain    HPI Cathy Todd is a 18 y.o. female with a past medical history of hypertension presenting to the ED for continued lower abdominal pain.  Patient was seen and evaluated on 07/11/2019 and was diagnosed with a threatened miscarriage.  She was told to follow-up with OB/GYN, monitor her bleeding.  She returned on 07/15/2019 with continued bleeding and abdominal pain.  Pelvic ultrasound revealed no abnormal findings but she did have a downtrending hCG.  States that overall her bleeding has improved but her abdominal pain has not.  Reports intermittent sharp pain in her lower abdomen without any specific aggravating alleviating factors.  Describes as similar sensation since diagnosed with miscarriage.  No improvement noted with ibuprofen.  She denies any nausea, vomiting, changes to bowel movements, vaginal discharge, fever, sick contacts with similar symptoms, urinary symptoms, back pain.  She has not yet followed up with an OB/GYN due to insurance issues.     HPI  Past Medical History:  Diagnosis Date  . Asthma   . Eczema   . Hypertension     There are no active problems to display for this patient.   Past Surgical History:  Procedure Laterality Date  . WISDOM TOOTH EXTRACTION       OB History    Gravida  3   Para      Term      Preterm      AB  2   Living        SAB      TAB      Ectopic      Multiple      Live Births               Home Medications    Prior to Admission medications   Medication Sig Start Date End Date Taking? Authorizing Provider  cephALEXin (KEFLEX) 500 MG capsule Take 1 capsule (500 mg total) by mouth 2 (two) times daily for 7 days. 07/17/19 07/24/19  Gracemarie Skeet, PA-C  dicyclomine (BENTYL) 20 MG tablet Take 1 tablet (20 mg total)  by mouth 2 (two) times daily. 07/17/19   Seri Kimmer, PA-C  potassium chloride (KLOR-CON) 10 MEQ tablet Take 2 tablets (20 mEq total) by mouth daily for 2 days. 07/17/19 07/19/19  Delia Heady, PA-C    Family History Family History  Problem Relation Age of Onset  . Hypertension Other   . Stroke Other   . Heart disease Other   . Diabetes Other     Social History Social History   Tobacco Use  . Smoking status: Never Smoker  . Smokeless tobacco: Never Used  Substance Use Topics  . Alcohol use: Yes    Comment: occ  . Drug use: Yes    Types: Marijuana     Allergies   Fish allergy, Peanut oil, Peanut-containing drug products, and Shellfish allergy   Review of Systems Review of Systems  Constitutional: Negative for appetite change, chills and fever.  HENT: Negative for ear pain, rhinorrhea, sneezing and sore throat.   Eyes: Negative for photophobia and visual disturbance.  Respiratory: Negative for cough, chest tightness, shortness of breath and wheezing.   Cardiovascular: Negative for chest pain and palpitations.  Gastrointestinal: Positive for abdominal pain. Negative for blood in stool, constipation, diarrhea, nausea  and vomiting.  Genitourinary: Negative for dysuria, hematuria and urgency.  Musculoskeletal: Negative for myalgias.  Skin: Negative for rash.  Neurological: Negative for dizziness, weakness and light-headedness.     Physical Exam Updated Vital Signs BP 131/89 (BP Location: Right Arm)   Pulse 77   Temp 98.5 F (36.9 C) (Oral)   Resp 20   Ht  (1.626 m)   Wt 89.8 kg   LMP 07/17/2019   SpO2 100%   BMI 33.99 kg/m   Physical Exam Vitals signs and nursing note reviewed.  Constitutional:      General: She is not in acute distress.    Appearance: She is well-developed.  HENT:     Head: Normocephalic and atraumatic.     Nose: Nose normal.  Eyes:     General: No scleral icterus.       Left eye: No discharge.     Conjunctiva/sclera: Conjunctivae  normal.  Neck:     Musculoskeletal: Normal range of motion and neck supple.  Cardiovascular:     Rate and Rhythm: Normal rate and regular rhythm.     Heart sounds: Normal heart sounds. No murmur. No friction rub. No gallop.   Pulmonary:     Effort: Pulmonary effort is normal. No respiratory distress.     Breath sounds: Normal breath sounds.  Abdominal:     General: Bowel sounds are normal. There is no distension.     Palpations: Abdomen is soft.     Tenderness: There is abdominal tenderness in the suprapubic area. There is no guarding.  Musculoskeletal: Normal range of motion.  Skin:    General: Skin is warm and dry.     Findings: No rash.  Neurological:     Mental Status: She is alert.     Motor: No abnormal muscle tone.     Coordination: Coordination normal.      ED Treatments / Results  Labs (all labs ordered are listed, but only abnormal results are displayed) Labs Reviewed  BASIC METABOLIC PANEL - Abnormal; Notable for the following components:      Result Value   Potassium 3.2 (*)    Calcium 8.6 (*)    All other components within normal limits  CBC WITH DIFFERENTIAL/PLATELET - Abnormal; Notable for the following components:   Platelets 454 (*)    All other components within normal limits  HCG, QUANTITATIVE, PREGNANCY - Abnormal; Notable for the following components:   hCG, Beta Chain, Quant, S 22 (*)    All other components within normal limits  URINALYSIS, ROUTINE W REFLEX MICROSCOPIC - Abnormal; Notable for the following components:   Color, Urine RED (*)    APPearance CLOUDY (*)    Hgb urine dipstick LARGE (*)    Bilirubin Urine SMALL (*)    Protein, ur 100 (*)    Leukocytes,Ua SMALL (*)    All other components within normal limits  URINALYSIS, MICROSCOPIC (REFLEX) - Abnormal; Notable for the following components:   Bacteria, UA MANY (*)    All other components within normal limits    EKG None  Radiology US Ob Less Than 14 Weeks With Ob Transvaginal   Result Date: 07/15/2019 CLINICAL DATA:  Vaginal bleeding, pelvic pain EXAM: OBSTETRIC <14 WK [place] AND TRANSVAGINAL OB US TECHNIQUE: Both transabdominal and transvaginal ultrasound examinations were performed for complete evaluation of the gestation as well as the maternal uterus, adnexal regions, and pelvic cul-de-sac. Transvaginal technique was performed to assess early pregnancy. COMPARISON:  None. FINDINGS: Intrauterine gestational  sac: None Yolk sac:  Not visualized Embryo:  Not visualized Cardiac Activity: Heart Rate:   bpm MSD:   mm    w     d CRL:    mm    w    d                  [place] EDC: Subchorionic hemorrhage:  None visualized. Maternal uterus/adnexae: No adnexal mass or free fluid. Small amount of fluid in the lower endometrial canal. IMPRESSION: No intrauterine pregnancy visualized. Differential considerations would include early intrauterine pregnancy too early to visualize, spontaneous abortion, or occult ectopic pregnancy. Recommend close clinical followup and serial quantitative beta HCGs and ultrasounds. Electronically Signed   By: Charlett Nose M.D.   On: 07/15/2019 21:35    Procedures Procedures (including critical care time)  Medications Ordered in ED Medications  potassium chloride SA (KLOR-CON) CR tablet 40 mEq (has no administration in time range)  dicyclomine (BENTYL) injection 20 mg (20 mg Intramuscular Given 07/17/19 0957)     Initial Impression / Assessment and Plan / ED Course  I have reviewed the triage vital signs and the nursing notes.  Pertinent labs & imaging results that were available during my care of the patient were reviewed by me and considered in my medical decision making (see chart for details).  Clinical Course as of Jul 16 1021  Sat Jul 17, 2019  1005 Will replete orally.  Potassium(!): 3.2 [HK]  1014 Trending downward for the past 6 days.  HCG, Beta Chain, Quant, S(!): 22 [HK]    Clinical Course User Index [HK] Dietrich Pates, PA-C        18 year old female presents to ED with a chief complaint of abdominal pain.  This is her third visit in the past 6 days for similar symptoms.  She was diagnosed with a threatened miscarriage on her first visit and followed up here in the ED 2 days ago and pelvic ultrasound revealed no IUP or other abnormalities.  States that her pain now is similar but has not improved.  States that her bleeding did improve gradually over the course of the week.  She has not yet followed up with OB/GYN.  On exam patient is overall well-appearing.  Abdomen is tender in the suprapubic area without rebound or guarding.  Her vital signs are within normal limits.  Lab work significant for downward trending hCG and potassium of 3.2.  Urinalysis shows many bacteria, leukocytes.  Patient was given Bentyl and oral potassium here.  Repeat exam exams are benign.  Will treat for UTI due to UA findings as well as location of pain.  Do not feel that repeat imaging is warranted at this time as her symptoms are improving.  Stressed the importance of OB/GYN follow-up.    Patient is hemodynamically stable, in NAD, and able to ambulate in the ED. Evaluation does not show pathology that would require ongoing emergent intervention or inpatient treatment. I explained the diagnosis to the patient. Pain has been managed and has no complaints prior to discharge. Patient is comfortable with above plan and is stable for discharge at this time. All questions were answered prior to disposition. Strict return precautions for returning to the ED were discussed. Encouraged follow up with PCP.   An After Visit Summary was printed and given to the patient.   Portions of this note were generated with Scientist, clinical (histocompatibility and immunogenetics). Dictation errors may occur despite best attempts at proofreading.   Final Clinical Impressions(s) /  ED Diagnoses   Final diagnoses:  Lower urinary tract infectious disease  Hypokalemia    ED Discharge Orders         Ordered     cephALEXin (KEFLEX) 500 MG capsule  2 times daily     07/17/19 1020    dicyclomine (BENTYL) 20 MG tablet  2 times daily     07/17/19 1020    potassium chloride (KLOR-CON) 10 MEQ tablet  Daily     07/17/19 1020           Dietrich PatesKhatri, Desaree Downen, PA-C 07/17/19 1022    Raeford RazorKohut, Stephen, MD 07/17/19 1024

## 2019-07-17 NOTE — ED Triage Notes (Signed)
Was seen this past Thursday for abdominal pain and bleeding.  Patient had a miscarriage and was encouraged to come back if pain becomes worst. Pt is still bleeding slightly.

## 2019-10-31 ENCOUNTER — Other Ambulatory Visit: Payer: Self-pay

## 2019-10-31 ENCOUNTER — Encounter (HOSPITAL_BASED_OUTPATIENT_CLINIC_OR_DEPARTMENT_OTHER): Payer: Self-pay | Admitting: *Deleted

## 2019-10-31 ENCOUNTER — Emergency Department (HOSPITAL_BASED_OUTPATIENT_CLINIC_OR_DEPARTMENT_OTHER)
Admission: EM | Admit: 2019-10-31 | Discharge: 2019-10-31 | Disposition: A | Discharge status: Home / Self Care | Payer: Medicaid Other | Attending: Emergency Medicine | Admitting: Emergency Medicine

## 2019-10-31 DIAGNOSIS — Z79899 Other long term (current) drug therapy: Secondary | ICD-10-CM | POA: Insufficient documentation

## 2019-10-31 DIAGNOSIS — R2232 Localized swelling, mass and lump, left upper limb: Secondary | ICD-10-CM | POA: Diagnosis present

## 2019-10-31 DIAGNOSIS — Z91010 Allergy to peanuts: Secondary | ICD-10-CM | POA: Insufficient documentation

## 2019-10-31 DIAGNOSIS — I10 Essential (primary) hypertension: Secondary | ICD-10-CM | POA: Insufficient documentation

## 2019-10-31 DIAGNOSIS — J45909 Unspecified asthma, uncomplicated: Secondary | ICD-10-CM | POA: Insufficient documentation

## 2019-10-31 DIAGNOSIS — L02412 Cutaneous abscess of left axilla: Secondary | ICD-10-CM | POA: Insufficient documentation

## 2019-10-31 MED ORDER — OXYCODONE-ACETAMINOPHEN 5-325 MG PO TABS: 1.0000 | ORAL_TABLET | Freq: Four times a day (QID) | ORAL | 0 refills | Status: DC | PRN

## 2019-10-31 MED ORDER — LIDOCAINE-EPINEPHRINE (PF) 2 %-1:200000 IJ SOLN
10.0000 mL | Freq: Once | INTRAMUSCULAR | Status: AC
  Administered 2019-10-31: 10 mL
  Filled 2019-10-31: qty 10

## 2019-10-31 MED ORDER — PENTAFLUOROPROP-TETRAFLUOROETH EX AERO
INHALATION_SPRAY | CUTANEOUS | Status: DC | PRN
  Administered 2019-10-31: 30 via TOPICAL
  Filled 2019-10-31: qty 30

## 2019-10-31 MED ORDER — DOXYCYCLINE HYCLATE 100 MG PO CAPS: 100.0000 mg | ORAL_CAPSULE | Freq: Two times a day (BID) | ORAL | 0 refills | Status: DC

## 2019-10-31 NOTE — ED Triage Notes (Signed)
Painful abscess under left arm x 1 week. Painful to touch and red

## 2019-10-31 NOTE — Discharge Instructions (Addendum)
Please do not put fat back or any meat based product on your skin as this has the potential to cause infections with different bacteria than you already have infection with.    Please take Ibuprofen (Advil, motrin) and Tylenol (acetaminophen) to relieve your pain.  You may take up to 600 MG (3 pills) of normal strength ibuprofen every 8 hours as needed.  In between doses of ibuprofen you make take tylenol, up to 1,000 mg (two extra strength pills).  Do not take more than 3,000 mg tylenol in a 24 hour period.  Please check all medication labels as many medications such as pain and cold medications may contain tylenol.  Do not drink alcohol while taking these medications.  Do not take other NSAID'S while taking ibuprofen (such as aleve or naproxen).  Please take ibuprofen with food to decrease stomach upset.  You may have diarrhea from the antibiotics.  It is very important that you continue to take the antibiotics even if you get diarrhea unless a medical professional tells you that you may stop taking them.  If you stop too early the bacteria you are being treated for will become stronger and you may need different, more powerful antibiotics that have more side effects and worsening diarrhea.  Please stay well hydrated and consider probiotics as they may decrease the severity of your diarrhea.  Please be aware that if you take any hormonal contraception (birth control pills, nexplanon, the ring, etc) that your birth control will not work while you are taking antibiotics and you need to use back up protection as directed on the birth control medication information insert.

## 2019-10-31 NOTE — ED Provider Notes (Signed)
[facility] EMERGENCY DEPARTMENT Provider Note   CSN: [id number] Arrival date & time: 10/31/19  1607     History Chief Complaint  Patient presents with   Abscess    Cathy Todd is a 19 y.o. female with a past medical history of asthma, eczema, hypertension, who presents today for evaluation of a left axillary abscess. She reports that over the past 4 days she has had worsening pain and swelling in this area.  She initially noticed a "bump" about 2 weeks ago after she shaved.  She denies any fevers.  She reports that otherwise she generally feels well.  She reports that she has tried putting "fat back" on it but no relief.  She has also been trying warm compresses with out relief.      HPI     Past Medical History:  Diagnosis Date   Asthma    Eczema    Hypertension     There are no problems to display for this patient.   Past Surgical History:  Procedure Laterality Date   WISDOM TOOTH EXTRACTION       OB History    Gravida  3   Para      Term      Preterm      AB  2   Living        SAB      TAB      Ectopic      Multiple      Live Births              Family History  Problem Relation Age of Onset   Hypertension Other    Stroke Other    Heart disease Other    Diabetes Other     Social History   Tobacco Use   Smoking status: Never Smoker   Smokeless tobacco: Never Used  Substance Use Topics   Alcohol use: Yes    Comment: occ   Drug use: Yes    Types: Marijuana    Home Medications Prior to Admission medications   Medication Sig Start Date End Date Taking? Authorizing Provider  dicyclomine (BENTYL) 20 MG tablet Take 1 tablet (20 mg total) by mouth 2 (two) times daily. 07/17/19   Khatri, Hina, PA-C  doxycycline (VIBRAMYCIN) 100 MG capsule Take 1 capsule (100 mg total) by mouth 2 (two) times daily. 10/31/19   Cristina Gong, PA-C  oxyCODONE-acetaminophen (PERCOCET/ROXICET) 5-325 MG tablet Take 1 tablet  by mouth every 6 (six) hours as needed for severe pain. 10/31/19   Cristina Gong, PA-C  potassium chloride (KLOR-CON) 10 MEQ tablet Take 2 tablets (20 mEq total) by mouth daily for 2 days. 07/17/19 07/19/19  Khatri, Hina, PA-C    Allergies    Fish allergy, Peanut oil, Peanut-containing drug products, and Shellfish allergy  Review of Systems   Review of Systems  Constitutional: Negative for chills and fever.  Gastrointestinal: Negative for abdominal pain and nausea.  Skin: Negative for wound.       Axillary swelling  All other systems reviewed and are negative.   Physical Exam Updated Vital Signs BP 117/71 (BP Location: Left Arm)    Pulse 75    Temp 98.5 F (36.9 C) (Oral)    Resp 16    Ht 5\' 4"  (1.626 m)    Wt 95.3 kg    LMP 10/14/2019    SpO2 100%    Breastfeeding No    BMI 36.05 kg/m   Physical  Exam Vitals and nursing note reviewed.  Constitutional:      General: She is not in acute distress.    Appearance: She is well-developed. She is not diaphoretic.  HENT:     Head: Normocephalic and atraumatic.  Eyes:     General: No scleral icterus.       Right eye: No discharge.        Left eye: No discharge.     Conjunctiva/sclera: Conjunctivae normal.  Cardiovascular:     Rate and Rhythm: Normal rate and regular rhythm.  Pulmonary:     Effort: Pulmonary effort is normal. No respiratory distress.     Breath sounds: No stridor.  Abdominal:     General: There is no distension.  Musculoskeletal:        General: No deformity.     Cervical back: Normal range of motion.  Skin:    General: Skin is warm and dry.     Comments: 1cmx4cm area of swelling in left axilla.  Area is TTP and red. There is surrounding induration and fluctuance.   Neurological:     Mental Status: She is alert.     Motor: No abnormal muscle tone.  Psychiatric:        Behavior: Behavior normal.     ED Results / Procedures / Treatments   Labs (all labs ordered are listed, but only abnormal results are  displayed) Labs Reviewed - No data to display  EKG None  Radiology No results found.  Procedures .Marland KitchenIncision and Drainage  Date/Time: 10/31/2019 10:32 PM Performed by: Lorin Glass, PA-C Authorized by: Lorin Glass, PA-C   Consent:    Consent obtained:  Verbal   Consent given by:  Patient   Risks discussed:  Bleeding, incomplete drainage, pain and infection (Damage to other structures, need for additional procedures)   Alternatives discussed:  No treatment, alternative treatment and referral Location:    Type:  Abscess   Size:  4cmx1cm   Location: Left axilla. Pre-procedure details:    Skin preparation:  Chloraprep Anesthesia (see MAR for exact dosages):    Anesthesia method:  Local infiltration and topical application   Topical anesthesia: Cold spray.   Local anesthetic:  Lidocaine 2% WITH epi Procedure type:    Complexity:  Complex Procedure details:    Incision types:  Stab incision   Incision depth:  Subcutaneous   Scalpel blade:  11   Wound management:  Probed and deloculated and irrigated with saline   Drainage:  Bloody and purulent   Drainage amount:  Moderate   Packing materials:  1/4 in gauze   Amount 1/4":  1 continue with strip Post-procedure details:    Patient tolerance of procedure:  Tolerated well, no immediate complications   (including critical care time)  Medications Ordered in ED Medications  pentafluoroprop-tetrafluoroeth (GEBAUERS) aerosol (30 application Topical Given 10/31/19 1733)  lidocaine-EPINEPHrine (XYLOCAINE W/EPI) 2 %-1:200000 (PF) injection 10 mL (10 mLs Infiltration Given 10/31/19 1734)    ED Course  I have reviewed the triage vital signs and the nursing notes.  Pertinent labs & imaging results that were available during my care of the patient were reviewed by me and considered in my medical decision making (see chart for details).    MDM Rules/Calculators/A&P                     Patient presents today for  evaluation of swelling in her left axilla.  On exam she has a fluctuant swelling  consistent with an abscess measuring approximately 4 cm x 1 cm. Patient gave verbal consent for incision and drainage after we discussed risks, benefits and alternatives. I&D performed without difficulty, please see procedure note. Based on the size of the cavity 1 continuous strip of gauze was placed. Patient is afebrile, not tachycardic or tachypneic. Patient denies any concern for pregnancy, we discussed that if there is any possibility of pregnancy she should take a pregnancy test prior to starting her antibiotics. Given the location she is placed on doxycycline. We discussed ways to reduce risk of recurrent abscesses. [facility] [place] PMP is consulted and she is given a short course of prescription pain medication along with instructions on over-the-counter pain medications. We discussed not putting fat back or other raw animal products on wounds, along with appropriate conservative care of abscesses for her future knowledge.  Return precautions were discussed with patient who states their understanding.  At the time of discharge patient denied any unaddressed complaints or concerns.  Patient is agreeable for discharge home.  Note: Portions of this report may have been transcribed using voice recognition software. Every effort was made to ensure accuracy; however, inadvertent computerized transcription errors may be present  Final Clinical Impression(s) / ED Diagnoses Final diagnoses:  Abscess of axilla, left    Rx / DC Orders ED Discharge Orders         Ordered    oxyCODONE-acetaminophen (PERCOCET/ROXICET) 5-325 MG tablet  Every 6 hours PRN     10/31/19 1807    doxycycline (VIBRAMYCIN) 100 MG capsule  2 times daily     10/31/19 1807           Cristina Gong, Cordelia Poche 10/31/19 2235    Virgina Norfolk, DO 10/31/19 2320

## 2019-11-02 ENCOUNTER — Encounter (HOSPITAL_BASED_OUTPATIENT_CLINIC_OR_DEPARTMENT_OTHER): Payer: Self-pay | Admitting: *Deleted

## 2019-11-02 ENCOUNTER — Other Ambulatory Visit: Payer: Self-pay

## 2019-11-02 ENCOUNTER — Emergency Department (HOSPITAL_BASED_OUTPATIENT_CLINIC_OR_DEPARTMENT_OTHER)
Admission: EM | Admit: 2019-11-02 | Discharge: 2019-11-02 | Disposition: A | Discharge status: Home / Self Care | Payer: Medicaid Other | Attending: Emergency Medicine | Admitting: Emergency Medicine

## 2019-11-02 DIAGNOSIS — I10 Essential (primary) hypertension: Secondary | ICD-10-CM | POA: Diagnosis not present

## 2019-11-02 DIAGNOSIS — Z4801 Encounter for change or removal of surgical wound dressing: Secondary | ICD-10-CM | POA: Insufficient documentation

## 2019-11-02 DIAGNOSIS — Z5189 Encounter for other specified aftercare: Secondary | ICD-10-CM

## 2019-11-02 DIAGNOSIS — J45909 Unspecified asthma, uncomplicated: Secondary | ICD-10-CM | POA: Insufficient documentation

## 2019-11-02 DIAGNOSIS — Z4800 Encounter for change or removal of nonsurgical wound dressing: Secondary | ICD-10-CM

## 2019-11-02 NOTE — ED Triage Notes (Signed)
Here for recheck and removal of packing from an I&D abscess to her left axilla.

## 2019-11-02 NOTE — Discharge Instructions (Signed)
You were seen in the emergency department for a skin abscess packing removal. We would like you to apply warm compresses and warm flushes to this area 4-5 times per day to help facilitate further draining as needed.  Please continue the medications prescribed at your prior ER visit.  Please follow-up with your primary care provider within 1 week for reevaluation of this area.  Return to the ER sooner for new or worsening symptoms including, but not limited to increased pain, spreading redness, fevers, inability to keep fluids down, or any other concerns that you may have.

## 2019-11-02 NOTE — ED Provider Notes (Signed)
[facility] Provider Note   CSN: [id number] Arrival date & time: 11/02/19  1251     History Chief Complaint  Patient presents with  . Wound Check    Cathy Todd is a 19 y.o. female with a history of asthma, eczema & HTN who presents to the ED for wound recheck and packing removal s/p I&D to L axillary abscess 10/31/19.  Patient states the area has been doing overall well status post I&D.  She has had continued drainage with some mild discomfort.  No alleviating or aggravating factors.  Taking her previously prescribed medications including doxycycline.  Denies fever, chills, nausea, vomiting, or any new skin changes.  HPI     Past Medical History:  Diagnosis Date  . Asthma   . Eczema   . Hypertension     There are no problems to display for this patient.   Past Surgical History:  Procedure Laterality Date  . WISDOM TOOTH EXTRACTION       OB History    Gravida  3   Para      Term      Preterm      AB  2   Living        SAB      TAB      Ectopic      Multiple      Live Births              Family History  Problem Relation Age of Onset  . Hypertension Other   . Stroke Other   . Heart disease Other   . Diabetes Other     Social History   Tobacco Use  . Smoking status: Never Smoker  . Smokeless tobacco: Never Used  Substance Use Topics  . Alcohol use: Yes    Comment: occ  . Drug use: Yes    Types: Marijuana    Home Medications Prior to Admission medications   Medication Sig Start Date End Date Taking? Authorizing Provider  dicyclomine (BENTYL) 20 MG tablet Take 1 tablet (20 mg total) by mouth 2 (two) times daily. 07/17/19   Khatri, Hina, PA-C  doxycycline (VIBRAMYCIN) 100 MG capsule Take 1 capsule (100 mg total) by mouth 2 (two) times daily. 10/31/19   Lorin Glass, PA-C  oxyCODONE-acetaminophen (PERCOCET/ROXICET) 5-325 MG tablet Take 1 tablet by mouth every 6 (six) hours as needed for severe  pain. 10/31/19   Lorin Glass, PA-C  potassium chloride (KLOR-CON) 10 MEQ tablet Take 2 tablets (20 mEq total) by mouth daily for 2 days. 07/17/19 07/19/19  Khatri, Hina, PA-C    Allergies    Fish allergy, Peanut oil, Peanut-containing drug products, and Shellfish allergy  Review of Systems   Review of Systems  Constitutional: Negative for chills and fever.  Respiratory: Negative for shortness of breath.   Cardiovascular: Negative for chest pain.  Gastrointestinal: Negative for abdominal pain, nausea and vomiting.  Skin: Positive for wound.    Physical Exam Updated Vital Signs BP 118/80   Pulse 81   Temp 98 F (36.7 C) (Oral)   Resp 20   Ht 5\' 4"  (1.626 m)   Wt 95.3 kg   LMP 10/14/2019   SpO2 96%   BMI 36.05 kg/m   Physical Exam Vitals and nursing note reviewed.  Constitutional:      General: She is not in acute distress.    Appearance: She is well-developed.  HENT:     Head: Normocephalic and  atraumatic.  Eyes:     General:        Right eye: No discharge.        Left eye: No discharge.     Conjunctiva/sclera: Conjunctivae normal.  Cardiovascular:     Rate and Rhythm: Normal rate.  Pulmonary:     Effort: Pulmonary effort is normal.  Skin:    Comments: Left axilla: There is a small incision with quarter inch packing to this area.  Mild surrounding induration.  No palpable fluctuance.  No overlying erythema or warmth.  No active purulent drainage.  Neurological:     Mental Status: She is alert.     Comments: Clear speech.   Psychiatric:        Behavior: Behavior normal.        Thought Content: Thought content normal.     ED Results / Procedures / Treatments   Labs (all labs ordered are listed, but only abnormal results are displayed) Labs Reviewed - No data to display  EKG None  Radiology No results found.  Procedures Procedures (including critical care time)  Medications Ordered in ED Medications - No data to display  ED Course  I have  reviewed the triage vital signs and the nursing notes.  Pertinent labs & imaging results that were available during my care of the patient were reviewed by me and considered in my medical decision making (see chart for details).    MDM Rules/Calculators/A&P                      Patient presents to the emergency department s/p L axilla abscess I&D for wound recheck and packing removal today. Area appears to be progressing appropriately. Packing removed. No active drainage. No palpable fluctuance to indicate for further I&D. Mild induration, on doxy- complete course. Discussed warm compresses. PCP follow up. I discussed treatment plan, need for follow-up, and return precautions with the patient. Provided opportunity for questions, patient confirmed understanding and is in agreement with plan.   Final Clinical Impression(s) / ED Diagnoses Final diagnoses:  Wound check, abscess  Abscess packing removal    Rx / DC Orders ED Discharge Orders    None       Cherly Anderson, PA-C 11/02/19 1313    Cathren Laine, MD 11/02/19 1320

## 2019-11-08 ENCOUNTER — Emergency Department (HOSPITAL_BASED_OUTPATIENT_CLINIC_OR_DEPARTMENT_OTHER)
Admission: EM | Admit: 2019-11-08 | Discharge: 2019-11-08 | Disposition: A | Discharge status: Home / Self Care | Payer: Medicaid Other | Attending: Emergency Medicine | Admitting: Emergency Medicine

## 2019-11-08 ENCOUNTER — Other Ambulatory Visit: Payer: Self-pay

## 2019-11-08 ENCOUNTER — Encounter (HOSPITAL_BASED_OUTPATIENT_CLINIC_OR_DEPARTMENT_OTHER): Payer: Self-pay | Admitting: *Deleted

## 2019-11-08 DIAGNOSIS — Z91018 Allergy to other foods: Secondary | ICD-10-CM | POA: Diagnosis not present

## 2019-11-08 DIAGNOSIS — I10 Essential (primary) hypertension: Secondary | ICD-10-CM | POA: Diagnosis not present

## 2019-11-08 DIAGNOSIS — J069 Acute upper respiratory infection, unspecified: Secondary | ICD-10-CM | POA: Insufficient documentation

## 2019-11-08 DIAGNOSIS — Z91013 Allergy to seafood: Secondary | ICD-10-CM | POA: Diagnosis not present

## 2019-11-08 DIAGNOSIS — Z91010 Allergy to peanuts: Secondary | ICD-10-CM | POA: Diagnosis not present

## 2019-11-08 DIAGNOSIS — J45909 Unspecified asthma, uncomplicated: Secondary | ICD-10-CM | POA: Diagnosis not present

## 2019-11-08 DIAGNOSIS — J029 Acute pharyngitis, unspecified: Secondary | ICD-10-CM | POA: Diagnosis present

## 2019-11-08 DIAGNOSIS — Z20822 Contact with and (suspected) exposure to covid-19: Secondary | ICD-10-CM | POA: Insufficient documentation

## 2019-11-08 NOTE — ED Provider Notes (Signed)
[facility]PART[facility] Provider Note   CSN: [id number] Arrival date & time: 11/08/19  2242     History Chief Complaint  Patient presents with  . Sore Throat    Cathy Todd is a 19 y.o. female.  Patient presents to the emergency department for evaluation of sore throat.  Symptoms present for 1 day.  She reports that she has pain when she swallows, has used cough drops without improvement.  She works nights, slept during the day today.  She reports that she felt very congested and had some difficulty breathing through her nose.  She does have a cough which is dry and nonproductive.  She has not had any fever.  No change in smell or taste.        Past Medical History:  Diagnosis Date  . Asthma   . Eczema   . Hypertension     There are no problems to display for this patient.   Past Surgical History:  Procedure Laterality Date  . WISDOM TOOTH EXTRACTION       OB History    Gravida  3   Para      Term      Preterm      AB  2   Living        SAB      TAB      Ectopic      Multiple      Live Births              Family History  Problem Relation Age of Onset  . Hypertension Other   . Stroke Other   . Heart disease Other   . Diabetes Other     Social History   Tobacco Use  . Smoking status: Never Smoker  . Smokeless tobacco: Never Used  Substance Use Topics  . Alcohol use: Yes    Comment: occ  . Drug use: Yes    Types: Marijuana    Home Medications Prior to Admission medications   Not on File    Allergies    Fish allergy, Peanut oil, Peanut-containing drug products, and Shellfish allergy  Review of Systems   Review of Systems  HENT: Positive for congestion and sore throat.   Respiratory: Positive for cough.   All other systems reviewed and are negative.   Physical Exam Updated Vital Signs BP 131/79 (BP Location: Left Arm)   Pulse 78   Temp 98.1 F (36.7 C) (Oral)   Resp 18   Ht 5\' 4"  (1.626 m)    Wt 95.3 kg   LMP 10/14/2019   SpO2 100%   BMI 36.05 kg/m   Physical Exam Vitals and nursing note reviewed.  Constitutional:      General: She is not in acute distress.    Appearance: Normal appearance. She is well-developed.  HENT:     Head: Normocephalic and atraumatic.     Right Ear: Hearing normal.     Left Ear: Hearing normal.     Nose: Nose normal.  Eyes:     Conjunctiva/sclera: Conjunctivae normal.     Pupils: Pupils are equal, round, and reactive to light.  Cardiovascular:     Rate and Rhythm: Regular rhythm.     Heart sounds: S1 normal and S2 normal. No murmur. No friction rub. No gallop.   Pulmonary:     Effort: Pulmonary effort is normal. No respiratory distress.     Breath sounds: Normal breath sounds.  Chest:  Chest wall: No tenderness.  Abdominal:     General: Bowel sounds are normal.     Palpations: Abdomen is soft.     Tenderness: There is no abdominal tenderness. There is no guarding or rebound. Negative signs include Murphy's sign and McBurney's sign.     Hernia: No hernia is present.  Musculoskeletal:        General: Normal range of motion.     Cervical back: Normal range of motion and neck supple.  Skin:    General: Skin is warm and dry.     Findings: No rash.  Neurological:     Mental Status: She is alert and oriented to person, place, and time.     GCS: GCS eye subscore is 4. GCS verbal subscore is 5. GCS motor subscore is 6.     Cranial Nerves: No cranial nerve deficit.     Sensory: No sensory deficit.     Coordination: Coordination normal.  Psychiatric:        Speech: Speech normal.        Behavior: Behavior normal.        Thought Content: Thought content normal.     ED Results / Procedures / Treatments   Labs (all labs ordered are listed, but only abnormal results are displayed) Labs Reviewed  SARS CORONAVIRUS 2 (TAT 6-24 HRS)    EKG None  Radiology No results found.  Procedures Procedures (including critical care  time)  Medications Ordered in ED Medications - No data to display  ED Course  I have reviewed the triage vital signs and the nursing notes.  Pertinent labs & imaging results that were available during my care of the patient were reviewed by me and considered in my medical decision making (see chart for details).    MDM Rules/Calculators/A&P                      Patient presents with URI symptoms.  She appears well.  No difficulty breathing.  Lungs are clear on auscultation.  Vital signs are normal.  Oropharyngeal examination is normal.  No swelling, erythema, exudate or signs of abscess.  Likely allergic or simple URI, will provide Covid testing.  Final Clinical Impression(s) / ED Diagnoses Final diagnoses:  Upper respiratory tract infection, unspecified type    Rx / DC Orders ED Discharge Orders    None       Gilda Crease, MD 11/08/19 2322

## 2019-11-08 NOTE — ED Triage Notes (Signed)
Dry cough, sore throat x 1 day. Pt requesting covid test.

## 2019-11-09 LAB — SARS CORONAVIRUS 2 (TAT 6-24 HRS): SARS Coronavirus 2: NEGATIVE

## 2020-04-21 ENCOUNTER — Encounter (HOSPITAL_BASED_OUTPATIENT_CLINIC_OR_DEPARTMENT_OTHER): Payer: Self-pay

## 2020-04-21 ENCOUNTER — Emergency Department (HOSPITAL_BASED_OUTPATIENT_CLINIC_OR_DEPARTMENT_OTHER)
Admission: EM | Admit: 2020-04-21 | Discharge: 2020-04-21 | Disposition: A | Discharge status: Home / Self Care | Payer: Medicaid Other | Attending: Emergency Medicine | Admitting: Emergency Medicine

## 2020-04-21 ENCOUNTER — Other Ambulatory Visit: Payer: Self-pay

## 2020-04-21 DIAGNOSIS — Z5321 Procedure and treatment not carried out due to patient leaving prior to being seen by health care provider: Secondary | ICD-10-CM | POA: Insufficient documentation

## 2020-04-21 DIAGNOSIS — K0889 Other specified disorders of teeth and supporting structures: Secondary | ICD-10-CM | POA: Insufficient documentation

## 2020-04-21 NOTE — ED Triage Notes (Signed)
Pt c/o pain to gums x 1.5 weeks-NAD-steady gait

## 2020-05-02 ENCOUNTER — Other Ambulatory Visit: Payer: Self-pay

## 2020-05-02 ENCOUNTER — Encounter (HOSPITAL_BASED_OUTPATIENT_CLINIC_OR_DEPARTMENT_OTHER): Payer: Self-pay | Admitting: *Deleted

## 2020-05-02 ENCOUNTER — Emergency Department (HOSPITAL_BASED_OUTPATIENT_CLINIC_OR_DEPARTMENT_OTHER)
Admission: EM | Admit: 2020-05-02 | Discharge: 2020-05-02 | Disposition: A | Discharge status: Home / Self Care | Payer: Medicaid Other | Attending: Emergency Medicine | Admitting: Emergency Medicine

## 2020-05-02 DIAGNOSIS — R0981 Nasal congestion: Secondary | ICD-10-CM | POA: Insufficient documentation

## 2020-05-02 DIAGNOSIS — R6883 Chills (without fever): Secondary | ICD-10-CM | POA: Diagnosis not present

## 2020-05-02 DIAGNOSIS — J029 Acute pharyngitis, unspecified: Secondary | ICD-10-CM | POA: Insufficient documentation

## 2020-05-02 DIAGNOSIS — Z20822 Contact with and (suspected) exposure to covid-19: Secondary | ICD-10-CM | POA: Diagnosis not present

## 2020-05-02 DIAGNOSIS — Z91010 Allergy to peanuts: Secondary | ICD-10-CM | POA: Insufficient documentation

## 2020-05-02 DIAGNOSIS — J45909 Unspecified asthma, uncomplicated: Secondary | ICD-10-CM | POA: Insufficient documentation

## 2020-05-02 LAB — GROUP A STREP BY PCR: Group A Strep by PCR: NOT DETECTED

## 2020-05-02 LAB — SARS CORONAVIRUS 2 BY RT PCR (HOSPITAL ORDER, PERFORMED IN ~~LOC~~ HOSPITAL LAB): SARS Coronavirus 2: NEGATIVE

## 2020-05-02 MED ORDER — LIDOCAINE VISCOUS HCL 2 % MT SOLN: 15.0000 mL | OROMUCOSAL | 0 refills | Status: AC | PRN

## 2020-05-02 MED ORDER — DEXAMETHASONE 6 MG PO TABS
6.0000 mg | ORAL_TABLET | Freq: Once | ORAL | Status: AC
  Administered 2020-05-02: 6 mg via ORAL
  Filled 2020-05-02: qty 1

## 2020-05-02 MED ORDER — LIDOCAINE VISCOUS HCL 2 % MT SOLN
15.0000 mL | Freq: Once | OROMUCOSAL | Status: AC
  Administered 2020-05-02: 15 mL via OROMUCOSAL
  Filled 2020-05-02: qty 15

## 2020-05-02 NOTE — ED Triage Notes (Signed)
Runny nose, sore throat, chills and cough x 3 days. She had a covid test prior to symptoms due to exposure.

## 2020-05-02 NOTE — ED Provider Notes (Signed)
[facility] EMERGENCY DEPARTMENT Provider Note   CSN: [id number] Arrival date & time: 05/02/20  1936     History Chief Complaint  Patient presents with  . Sore Throat  . Chills    Cathy Todd is a 19 y.o. female with past medical history significant for asthma, eczema, hypertension.  HPI Patient presents to emergency department today with chief complaint of sore throat, chills, nasal congestion x3 days.  Cathy Todd had a negative Covid test x 4 days ago after Covid exposure.  This was before Cathy Todd symptoms had started.  Cathy Todd is describing Cathy Todd throat as feeling a scratching sensation when swallowing.  Cathy Todd cough is nonproductive.  Cathy Todd has tried taking DayQuil and NyQuil, using cough drops, and vapor rub without significant symptom improvement.  Cathy Todd denies any fever, chills, sinus pressure, hemoptysis, shortness of breath, neck pain, chest pain, abdominal pain, nausea, emesis, urinary symptoms, diarrhea, rash.    Past Medical History:  Diagnosis Date  . Asthma   . Eczema   . Hypertension     There are no problems to display for this patient.   Past Surgical History:  Procedure Laterality Date  . WISDOM TOOTH EXTRACTION       OB History    Gravida  3   Para      Term      Preterm      AB  2   Living        SAB      TAB      Ectopic      Multiple      Live Births              Family History  Problem Relation Age of Onset  . Hypertension Other   . Stroke Other   . Heart disease Other   . Diabetes Other     Social History   Tobacco Use  . Smoking status: Never Smoker  . Smokeless tobacco: Never Used  Vaping Use  . Vaping Use: Never used  Substance Use Topics  . Alcohol use: Yes    Comment: occ  . Drug use: Yes    Types: Marijuana    Home Medications Prior to Admission medications   Medication Sig Start Date End Date Taking? Authorizing Provider  lidocaine (XYLOCAINE) 2 % solution Use as directed 15 mLs in the mouth or throat as  needed for mouth pain. 05/02/20   Keltie Labell E, PA-C    Allergies    Fish allergy, Peanut oil, Peanut-containing drug products, and Shellfish allergy  Review of Systems   Review of Systems All other systems are reviewed and are negative for acute change except as noted in the HPI.  Physical Exam Updated Vital Signs BP 114/64   Pulse 81   Temp 99.5 F (37.5 C) (Oral)   Resp 20   Ht 5\' 4"  (1.626 m)   Wt 115.7 kg   LMP 04/18/2020   SpO2 100%   BMI 43.77 kg/m   Physical Exam Vitals and nursing note reviewed.  Constitutional:      Appearance: Cathy Todd is well-developed. Cathy Todd is not ill-appearing or toxic-appearing.  HENT:     Head: Normocephalic and atraumatic.     Comments: Minor erythema to oropharynx, no edema, no exudate, no tonsillar swelling, voice normal, neck supple without lymphadenopathy    Nose: Congestion present.     Mouth/Throat:     Pharynx: Uvula midline. No uvula swelling.  Eyes:     General: No  scleral icterus.       Right eye: No discharge.        Left eye: No discharge.     Conjunctiva/sclera: Conjunctivae normal.  Neck:     Vascular: No JVD.  Cardiovascular:     Rate and Rhythm: Normal rate and regular rhythm.     Pulses: Normal pulses.     Heart sounds: Normal heart sounds.  Pulmonary:     Effort: Pulmonary effort is normal.     Breath sounds: Normal breath sounds.  Abdominal:     General: There is no distension.  Musculoskeletal:        General: Normal range of motion.     Cervical back: Normal range of motion.  Lymphadenopathy:     Cervical: No cervical adenopathy.  Skin:    General: Skin is warm and dry.     Findings: No rash.  Neurological:     Mental Status: Cathy Todd is oriented to person, place, and time.     GCS: GCS eye subscore is 4. GCS verbal subscore is 5. GCS motor subscore is 6.     Comments: Fluent speech, no facial droop.  Psychiatric:        Behavior: Behavior normal.     ED Results / Procedures / Treatments   Labs (all  labs ordered are listed, but only abnormal results are displayed) Labs Reviewed  SARS CORONAVIRUS 2 BY RT PCR (HOSPITAL ORDER, PERFORMED IN [facility])  GROUP A STREP BY PCR    EKG None  Radiology No results found.  Procedures Procedures (including critical care time)  Medications Ordered in ED Medications  dexamethasone (DECADRON) tablet 6 mg (has no administration in time range)  lidocaine (XYLOCAINE) 2 % viscous mouth solution 15 mL (has no administration in time range)    ED Course  I have reviewed the triage vital signs and the nursing notes.  Pertinent labs & imaging results that were available during my care of the patient were reviewed by me and considered in my medical decision making (see chart for details).    MDM Rules/Calculators/A&P                          History provided by patient with additional history obtained from chart review.    Patient presents with URI type symptoms.  Patient is nontoxic appearing, in no apparent distress, vitals are WNL. Patient is afebrile in the ED, lungs are CTA,  doubt pneumonia. There is no wheezing or signs of respiratory distress. Sxs onset < 7 days, afebrile, no sinus tenderness, doubt acute bacterial sinusitis. Strep test negative. Covid test negative.  No evidence of AOM on exam. No meningeal signs. No history components or rashes to raise concern for tic borne illness. Suspect viral vs allergic etiology at this time and will treat supportively with Ibuprofen, Flonase, and OTC cough medicine.  I discussed results, treatment plan, need for PCP follow-up, and return precautions with the patient. Provided opportunity for questions, patient confirmed understanding and is in agreement with plan.   [facility] was evaluated in Emergency Department on 05/02/2020 for the symptoms described in the history of present illness. Cathy Todd was evaluated in the context of the global COVID-19 pandemic, which necessitated consideration  that the patient might be at risk for infection with the SARS-CoV-2 virus that causes COVID-19. Institutional protocols and algorithms that pertain to the evaluation of patients at risk for COVID-19 are in a state of  rapid change based on information released by regulatory bodies including the CDC and federal and state organizations. These policies and algorithms were followed during the patient's care in the ED.   Portions of this note were generated with Scientist, clinical (histocompatibility and immunogenetics). Dictation errors may occur despite best attempts at proofreading.  Final Clinical Impression(s) / ED Diagnoses Final diagnoses:  Viral pharyngitis    Rx / DC Orders ED Discharge Orders         Ordered    lidocaine (XYLOCAINE) 2 % solution  As needed        05/02/20 2257           Sherene Sires, PA-C 05/02/20 2305    Arby Barrette, MD 05/14/20 [phone]

## 2020-05-02 NOTE — Discharge Instructions (Signed)
Your Covid test today was negative.  Your strep test today was negative.  You were seen in the emergency today for upper respiratory symptoms, we suspect your symptoms are related to allergies or a virus at this time. There is no cure. Antibiotics are not effective, because the infection is caused by a virus, not by bacteria. I have prescribed you multiple medications to treat your symptoms.   -Flonase to be used 1 spray in each nostril daily.  This medication is used to treat your congestion.  -Over the counter cough medicine  -Ibuprofen to be taken once every 8 hours as needed for pain. Please take this medicine with food as it can cause stomach upset and at worst stomach bleeding. Do not take other NSAIDs such as motrin, aleve, advil, naproxen, mobic, etc as they are similar. You make take tylenol per over the counter dosing with this medicine safely.  More Symptomatic Treatments Options: Treatment is directed at relieving symptoms.  Treatment may include:  Increased fluid intake. Sports drinks offer valuable electrolytes, sugars, and fluids.  Breathing heated mist or steam (vaporizer or shower).  Eating chicken soup or other clear broths, and maintaining good nutrition.  Getting plenty of rest.  Using gargles or lozenges for comfort.  Controlling fevers with ibuprofen or acetaminophen as directed by your caregiver.  Increasing usage of your inhaler if you have asthma.  Return to work when your temperature has returned to normal.    You will need to follow-up with your primary care provider in 1 week if your symptoms have not improved.  If you do not have a primary care provider one is provided in your discharge instructions.  Return to the emergency department for any new or worsening symptoms including but not limited to persistent fever for 5 days, difficulty breathing, chest pain, rashes, passing out, or any other concerns.

## 2020-12-17 ENCOUNTER — Emergency Department (HOSPITAL_BASED_OUTPATIENT_CLINIC_OR_DEPARTMENT_OTHER)
Admission: EM | Admit: 2020-12-17 | Discharge: 2020-12-17 | Disposition: A | Discharge status: Home / Self Care | Payer: Medicaid Other | Attending: Emergency Medicine | Admitting: Emergency Medicine

## 2020-12-17 ENCOUNTER — Encounter (HOSPITAL_BASED_OUTPATIENT_CLINIC_OR_DEPARTMENT_OTHER): Payer: Self-pay | Admitting: Emergency Medicine

## 2020-12-17 ENCOUNTER — Other Ambulatory Visit: Payer: Self-pay

## 2020-12-17 DIAGNOSIS — J45909 Unspecified asthma, uncomplicated: Secondary | ICD-10-CM | POA: Diagnosis not present

## 2020-12-17 DIAGNOSIS — R300 Dysuria: Secondary | ICD-10-CM | POA: Insufficient documentation

## 2020-12-17 DIAGNOSIS — Z91010 Allergy to peanuts: Secondary | ICD-10-CM | POA: Insufficient documentation

## 2020-12-17 DIAGNOSIS — N898 Other specified noninflammatory disorders of vagina: Secondary | ICD-10-CM | POA: Diagnosis present

## 2020-12-17 DIAGNOSIS — I10 Essential (primary) hypertension: Secondary | ICD-10-CM | POA: Insufficient documentation

## 2020-12-17 DIAGNOSIS — N390 Urinary tract infection, site not specified: Secondary | ICD-10-CM

## 2020-12-17 LAB — WET PREP, GENITAL
Clue Cells Wet Prep HPF POC: NONE SEEN
Sperm: NONE SEEN
Trich, Wet Prep: NONE SEEN
Yeast Wet Prep HPF POC: NONE SEEN

## 2020-12-17 LAB — URINALYSIS, ROUTINE W REFLEX MICROSCOPIC
Bilirubin Urine: NEGATIVE
Glucose, UA: NEGATIVE mg/dL
Hgb urine dipstick: NEGATIVE
Ketones, ur: NEGATIVE mg/dL
Nitrite: NEGATIVE
Protein, ur: NEGATIVE mg/dL
Specific Gravity, Urine: 1.015 (ref 1.005–1.030)
pH: 6 (ref 5.0–8.0)

## 2020-12-17 LAB — URINALYSIS, MICROSCOPIC (REFLEX)

## 2020-12-17 LAB — PREGNANCY, URINE: Preg Test, Ur: NEGATIVE

## 2020-12-17 MED ORDER — DOXYCYCLINE HYCLATE 100 MG PO TABS
100.0000 mg | ORAL_TABLET | Freq: Once | ORAL | Status: AC
  Administered 2020-12-17: 100 mg via ORAL
  Filled 2020-12-17: qty 1

## 2020-12-17 MED ORDER — CEFTRIAXONE SODIUM 500 MG IJ SOLR
500.0000 mg | Freq: Once | INTRAMUSCULAR | Status: AC
  Administered 2020-12-17: 500 mg via INTRAMUSCULAR
  Filled 2020-12-17: qty 500

## 2020-12-17 MED ORDER — DOXYCYCLINE HYCLATE 100 MG PO CAPS: 100.0000 mg | ORAL_CAPSULE | Freq: Two times a day (BID) | ORAL | 0 refills | Status: DC

## 2020-12-17 NOTE — Discharge Instructions (Signed)
Take doxycycline twice daily for a week for UTI.  You should see your STD panel results sometime tomorrow.  If you are positive, your partner needs to be notified but you are already treated  See your doctor for follow  Please use a condom with safe sex practices  Return to ER if you have worse discharge, abdominal pain, vomiting, fevers.

## 2020-12-17 NOTE — ED Provider Notes (Signed)
[facility] EMERGENCY DEPARTMENT Provider Note   CSN: [id number] Arrival date & time: 12/17/20  1956     History Chief Complaint  Patient presents with  . Vaginal Discharge    [facility] is a 20 y.o. female year history of hypertension, here presenting with vaginal discharge.  She last had sex about a week and a half ago.  She just found out today that boyfriend may be cheating on her.  She started having whitish discharge for the last week or so.  She also has some mild dysuria as well.  Denies any fevers or chills.  Patient states that she wants to be checked for STDs.  The history is provided by the patient.       Past Medical History:  Diagnosis Date  . Asthma   . Eczema   . Hypertension     There are no problems to display for this patient.   Past Surgical History:  Procedure Laterality Date  . WISDOM TOOTH EXTRACTION       OB History    Gravida  3   Para      Term      Preterm      AB  2   Living        SAB      IAB      Ectopic      Multiple      Live Births              Family History  Problem Relation Age of Onset  . Hypertension Other   . Stroke Other   . Heart disease Other   . Diabetes Other     Social History   Tobacco Use  . Smoking status: Never Smoker  . Smokeless tobacco: Never Used  Vaping Use  . Vaping Use: Never used  Substance Use Topics  . Alcohol use: Yes    Comment: occ  . Drug use: Yes    Types: Marijuana    Home Medications Prior to Admission medications   Medication Sig Start Date End Date Taking? Authorizing Provider  lidocaine (XYLOCAINE) 2 % solution Use as directed 15 mLs in the mouth or throat as needed for mouth pain. 05/02/20   Namon Cirri E, PA-C    Allergies    Fish allergy, Peanut oil, Peanut-containing drug products, and Shellfish allergy  Review of Systems   Review of Systems  Genitourinary: Positive for vaginal discharge.  All other systems reviewed and are  negative.   Physical Exam Updated Vital Signs BP (!) 156/90   Pulse 79   Temp 98.2 F (36.8 C) (Oral)   Resp 16   LMP 10/10/2020   SpO2 100%   Physical Exam Vitals and nursing note reviewed.  Constitutional:      Appearance: Normal appearance.  HENT:     Head: Normocephalic.     Nose: Nose normal.     Mouth/Throat:     Mouth: Mucous membranes are moist.  Eyes:     Extraocular Movements: Extraocular movements intact.     Pupils: Pupils are equal, round, and reactive to light.  Cardiovascular:     Rate and Rhythm: Normal rate and regular rhythm.     Pulses: Normal pulses.     Heart sounds: Normal heart sounds.  Pulmonary:     Effort: Pulmonary effort is normal.     Breath sounds: Normal breath sounds.  Abdominal:     General: Abdomen is flat.  Palpations: Abdomen is soft.  Genitourinary:    Comments: Whitish discharge, no CMT or adnexal tenderness, no uterine tenderness  Musculoskeletal:        General: Normal range of motion.     Cervical back: Normal range of motion.  Skin:    General: Skin is warm.     Capillary Refill: Capillary refill takes less than 2 seconds.  Neurological:     General: No focal deficit present.     Mental Status: She is alert and oriented to person, place, and time.  Psychiatric:        Mood and Affect: Mood normal.        Behavior: Behavior normal.     ED Results / Procedures / Treatments   Labs (all labs ordered are listed, but only abnormal results are displayed) Labs Reviewed  WET PREP, GENITAL - Abnormal; Notable for the following components:      Result Value   WBC, Wet Prep HPF POC MANY (*)    All other components within normal limits  URINALYSIS, ROUTINE W REFLEX MICROSCOPIC - Abnormal; Notable for the following components:   APPearance HAZY (*)    Leukocytes,Ua LARGE (*)    All other components within normal limits  URINALYSIS, MICROSCOPIC (REFLEX) - Abnormal; Notable for the following components:   Bacteria, UA MANY  (*)    All other components within normal limits  PREGNANCY, URINE  GC/CHLAMYDIA PROBE AMP ([place]) NOT AT [facility]    EKG None  Radiology No results found.  Procedures Procedures   Medications Ordered in ED Medications  doxycycline (VIBRA-TABS) tablet 100 mg (100 mg Oral Given 12/17/20 2209)  cefTRIAXone (ROCEPHIN) injection 500 mg (500 mg Intramuscular Given 12/17/20 2209)    ED Course  I have reviewed the triage vital signs and the nursing notes.  Pertinent labs & imaging results that were available during my care of the patient were reviewed by me and considered in my medical decision making (see chart for details).    MDM Rules/Calculators/A&P                         Kyia Rhude is a 20 y.o. female who presented with dysuria and vaginal discharge.  Patient has no CMT on exam.  Patient does have many bacteria in her UA.  She is concerned for possible STD.  Patient was given Rocephin and doxycycline.  Will discharge home with course of doxycycline.  I told her that she can check her results on MyChart and if it is positive, her partner will need to be notified and treated    Final Clinical Impression(s) / ED Diagnoses Final diagnoses:  None    Rx / DC Orders ED Discharge Orders    None       Charlynne Pander, MD 12/17/20 2242

## 2020-12-17 NOTE — ED Triage Notes (Signed)
Pt reports vaginal discharge with odor, "not feeling right" in vagina since sex a week and a half ago. States dysuria.

## 2020-12-19 LAB — GC/CHLAMYDIA PROBE AMP (~~LOC~~) NOT AT ARMC
Chlamydia: POSITIVE — AB
Comment: NEGATIVE
Comment: NORMAL
Neisseria Gonorrhea: NEGATIVE

## 2021-01-26 IMAGING — US US OB < 14 WEEKS - US OB TV
1 series · 14 of 28 positions shown · non-contrast
Comparison: None.

CLINICAL DATA: Vaginal bleeding, pelvic pain

EXAM:
OBSTETRIC <14 WK US AND TRANSVAGINAL OB US
TECHNIQUE: Both transabdominal and transvaginal ultrasound examinations were
performed for complete evaluation of the gestation as well as the
maternal uterus, adnexal regions, and pelvic cul-de-sac.
Transvaginal technique was performed to assess early pregnancy.

[Series 1: us ob < 14 weeks - us ob tv · 14 of 35 slices shown]
[im 2/35]
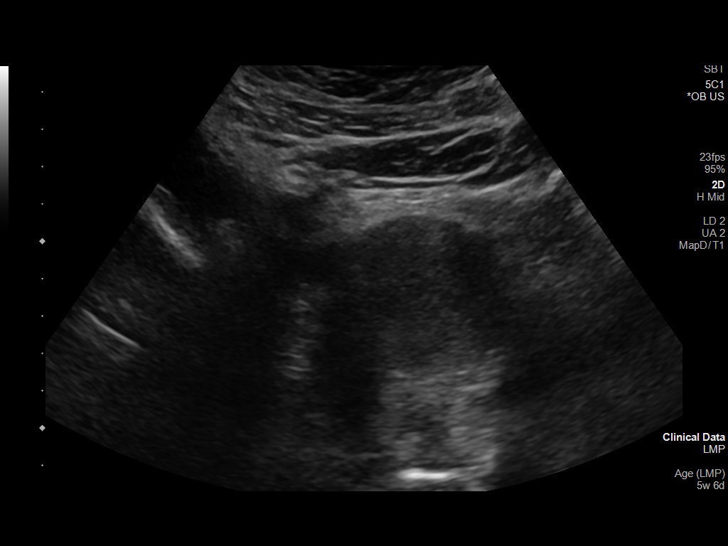
[im 4/35]
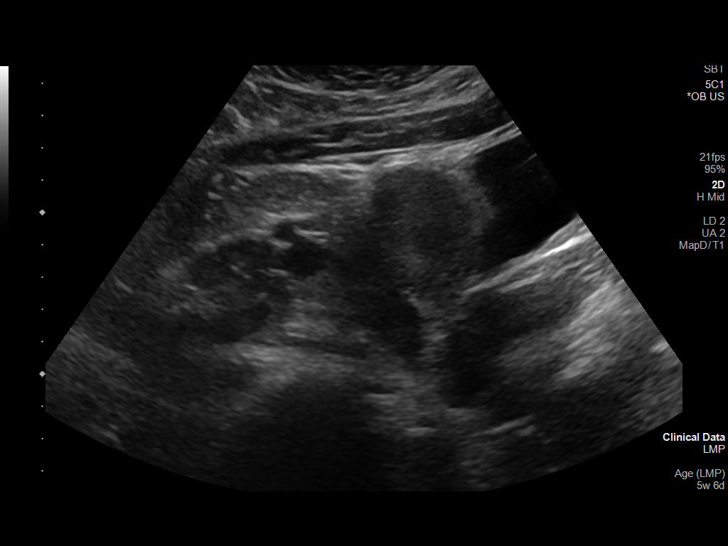
[im 7/35]
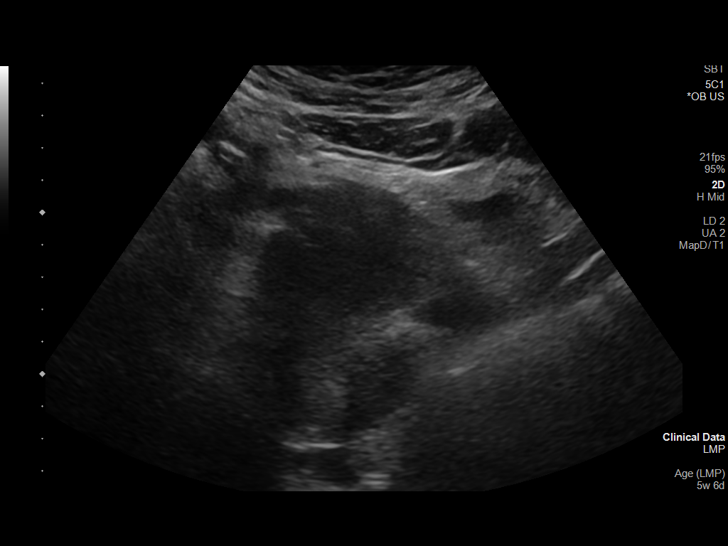
[im 9/35]
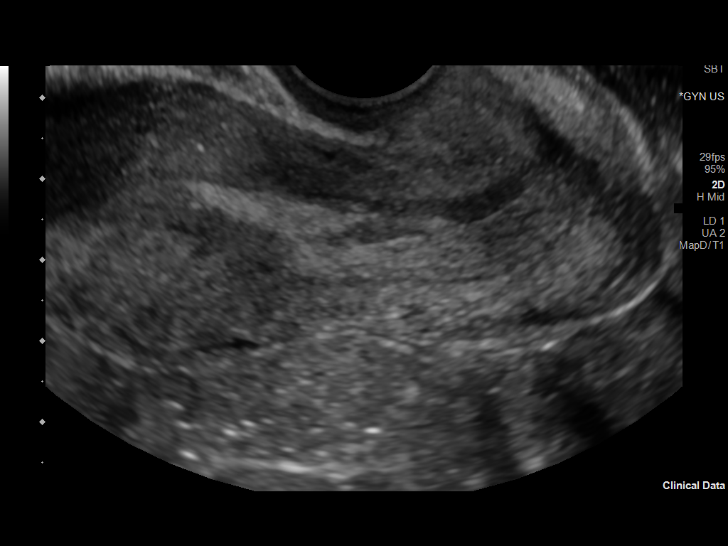
[im 12/35]
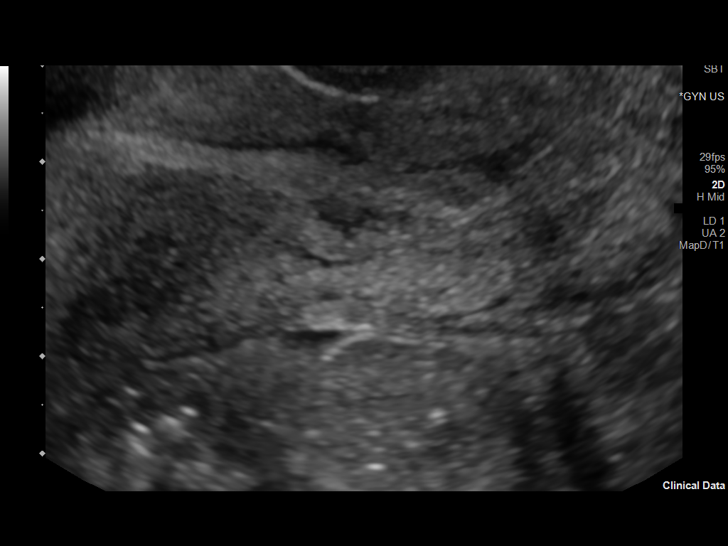
[im 14/35]
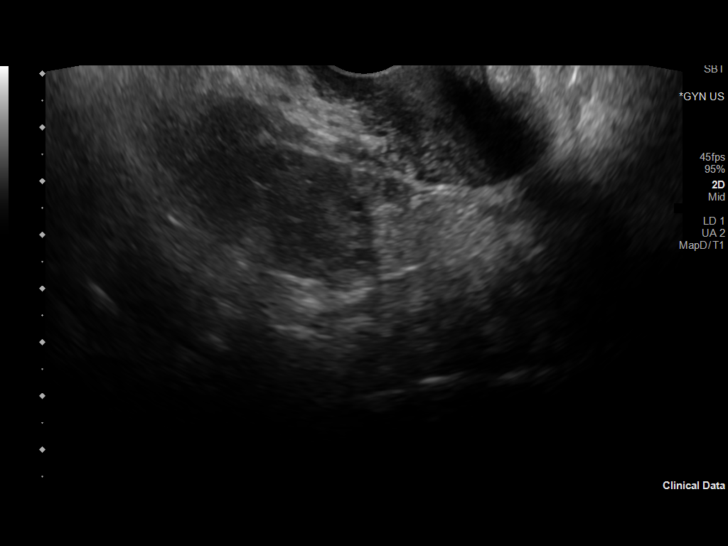
[im 17/35]
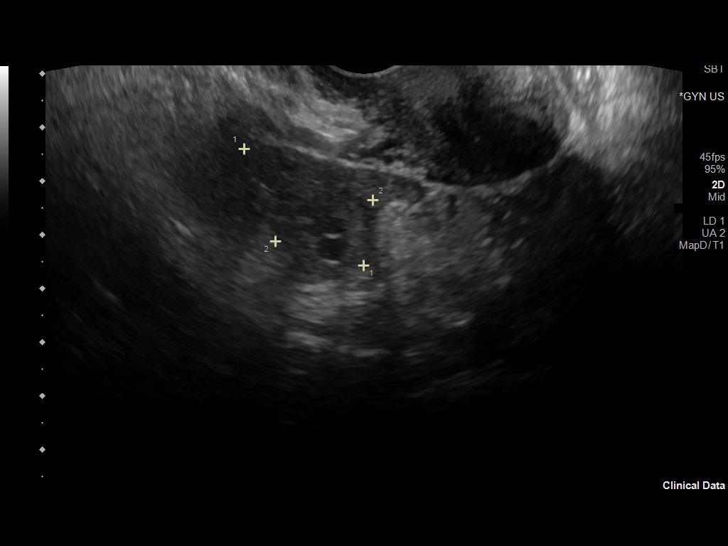
[im 19/35]
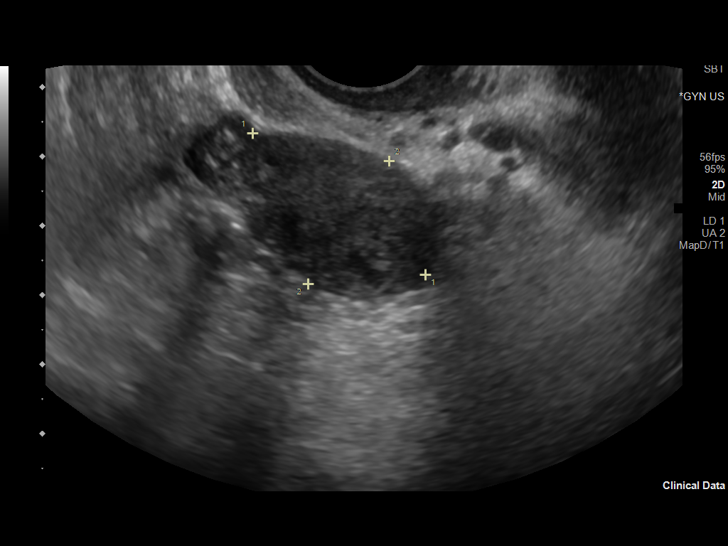
[im 22/35]
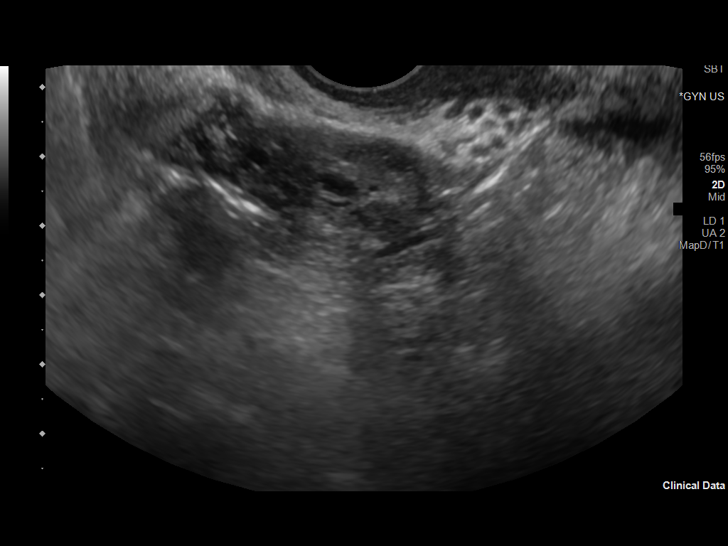
[im 24/35]
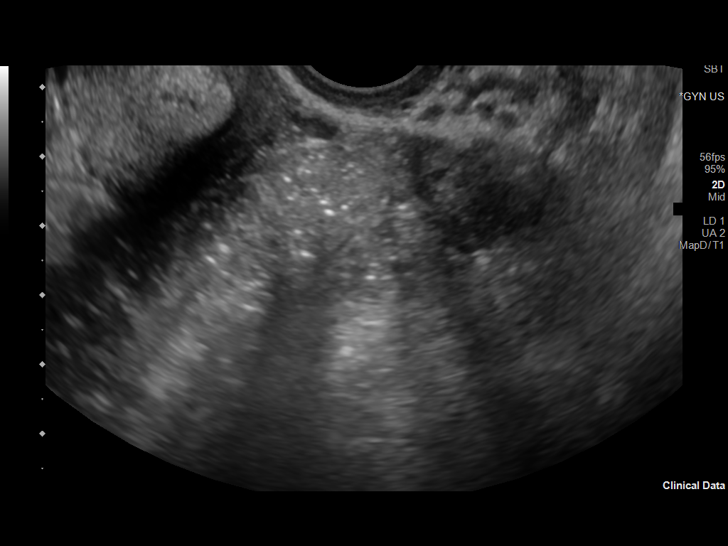
[im 27/35]
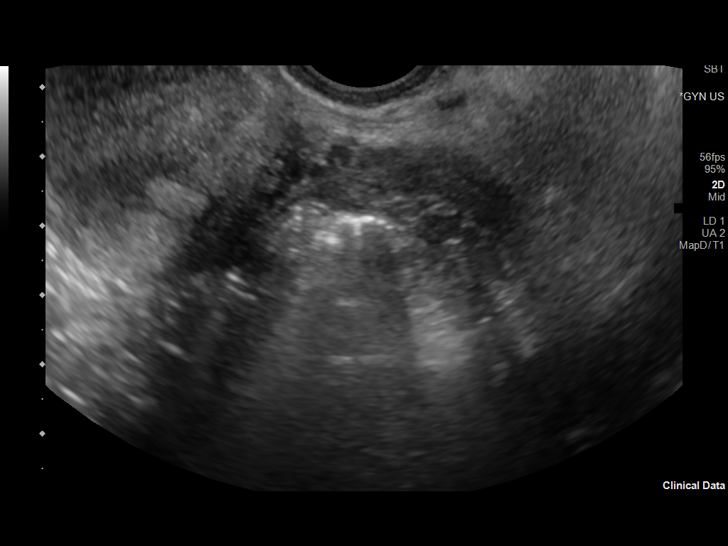
[im 29/35]
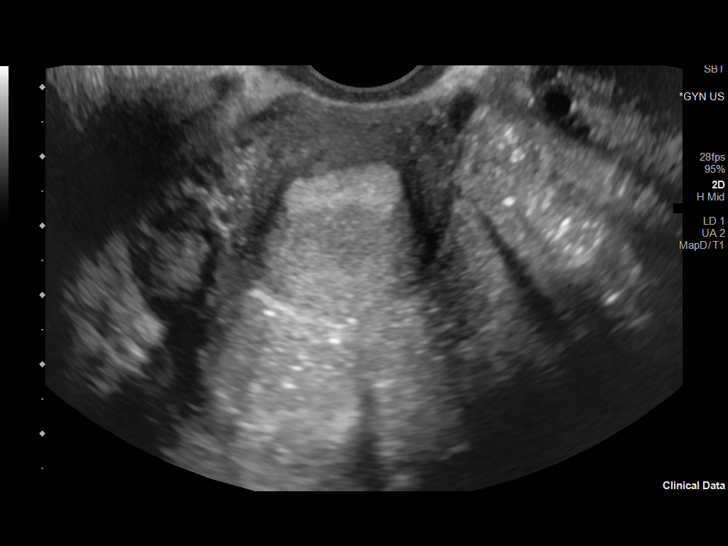
[im 32/35]
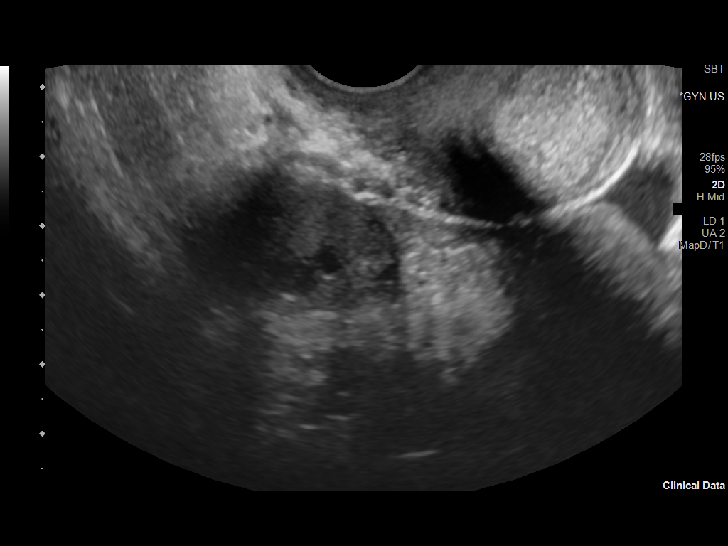
[im 35/35]
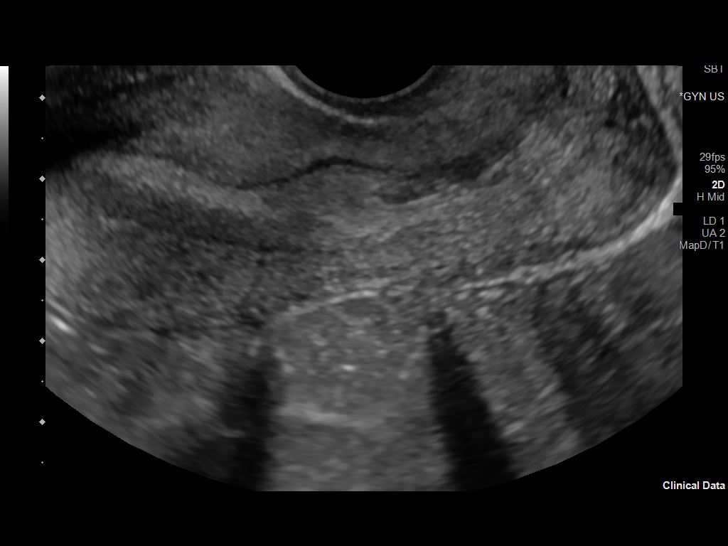

[14 of 28 positions shown; findings below may reference images not displayed]

FINDINGS: Intrauterine gestational sac: None

Yolk sac:  Not visualized

Embryo:  Not visualized

Cardiac Activity:

Heart Rate:   bpm

MSD:   mm    w     d

CRL:    mm    w    d                  US EDC:

Subchorionic hemorrhage:  None visualized.

Maternal uterus/adnexae: No adnexal mass or free fluid. Small amount
of fluid in the lower endometrial canal.
IMPRESSION: No intrauterine pregnancy visualized. Differential considerations
would include early intrauterine pregnancy too early to visualize,
spontaneous abortion, or occult ectopic pregnancy. Recommend close
clinical followup and serial quantitative beta HCGs and ultrasounds.

## 2022-09-26 ENCOUNTER — Other Ambulatory Visit: Payer: Self-pay

## 2022-09-26 ENCOUNTER — Emergency Department (HOSPITAL_BASED_OUTPATIENT_CLINIC_OR_DEPARTMENT_OTHER)
Admission: EM | Admit: 2022-09-26 | Discharge: 2022-09-27 | Disposition: A | Discharge status: Home / Self Care | Payer: Medicaid Other | Attending: Emergency Medicine | Admitting: Emergency Medicine

## 2022-09-26 ENCOUNTER — Encounter (HOSPITAL_BASED_OUTPATIENT_CLINIC_OR_DEPARTMENT_OTHER): Payer: Self-pay | Admitting: Urology

## 2022-09-26 DIAGNOSIS — Z91010 Allergy to peanuts: Secondary | ICD-10-CM | POA: Diagnosis not present

## 2022-09-26 DIAGNOSIS — Z3A10 10 weeks gestation of pregnancy: Secondary | ICD-10-CM | POA: Diagnosis not present

## 2022-09-26 DIAGNOSIS — E876 Hypokalemia: Secondary | ICD-10-CM | POA: Diagnosis not present

## 2022-09-26 DIAGNOSIS — E871 Hypo-osmolality and hyponatremia: Secondary | ICD-10-CM | POA: Insufficient documentation

## 2022-09-26 DIAGNOSIS — O219 Vomiting of pregnancy, unspecified: Secondary | ICD-10-CM | POA: Diagnosis present

## 2022-09-26 LAB — COMPREHENSIVE METABOLIC PANEL
ALT: 36 U/L (ref 0–44)
AST: 25 U/L (ref 15–41)
Albumin: 4.1 g/dL (ref 3.5–5.0)
Alkaline Phosphatase: 53 U/L (ref 38–126)
Anion gap: 12 (ref 5–15)
BUN: 9 mg/dL (ref 6–20)
CO2: 22 mmol/L (ref 22–32)
Calcium: 9.3 mg/dL (ref 8.9–10.3)
Chloride: 99 mmol/L (ref 98–111)
Creatinine, Ser: 0.77 mg/dL (ref 0.44–1.00)
GFR, Estimated: >60 mL/min (ref >60)
Glucose, Bld: 102 mg/dL — ABNORMAL HIGH (ref 70–99)
Potassium: 3.2 mmol/L — ABNORMAL LOW (ref 3.5–5.1)
Sodium: 133 mmol/L — ABNORMAL LOW (ref 135–145)
Total Bilirubin: 0.4 mg/dL (ref 0.3–1.2)
Total Protein: 8.9 g/dL — ABNORMAL HIGH (ref 6.5–8.1)

## 2022-09-26 LAB — CBC WITH DIFFERENTIAL/PLATELET
Abs Immature Granulocytes: 0.04 10*3/uL (ref 0.00–0.07)
Basophils Absolute: 0 10*3/uL (ref 0.0–0.1)
Basophils Relative: 0 %
Eosinophils Absolute: 0 10*3/uL (ref 0.0–0.5)
Eosinophils Relative: 0 %
HCT: 38.5 % (ref 36.0–46.0)
Hemoglobin: 12.7 g/dL (ref 12.0–15.0)
Immature Granulocytes: 0 %
Lymphocytes Relative: 29 %
Lymphs Abs: 3.3 10*3/uL (ref 0.7–4.0)
MCH: 27 pg (ref 26.0–34.0)
MCHC: 33 g/dL (ref 30.0–36.0)
MCV: 81.7 fL (ref 80.0–100.0)
Monocytes Absolute: 0.8 10*3/uL (ref 0.1–1.0)
Monocytes Relative: 7 %
Neutro Abs: 7.2 10*3/uL (ref 1.7–7.7)
Neutrophils Relative %: 64 %
Platelets: 519 10*3/uL — ABNORMAL HIGH (ref 150–400)
RBC: 4.71 MIL/uL (ref 3.87–5.11)
RDW: 13.1 % (ref 11.5–15.5)
WBC: 11.3 10*3/uL — ABNORMAL HIGH (ref 4.0–10.5)
nRBC: 0 % (ref 0.0–0.2)

## 2022-09-26 LAB — HCG, QUANTITATIVE, PREGNANCY: hCG, Beta Chain, Quant, S: 134059 m[IU]/mL — ABNORMAL HIGH (ref <5)

## 2022-09-26 NOTE — ED Triage Notes (Signed)
Pt states she is [redacted] weeks pregnant  Difficulty eat and drinking over past 2 weeks Taking promethazine and prochlorperimide with no relief   G-2, P-O, A-O (miscarriage)  Has OBGYN appointment tomorrow

## 2022-09-27 LAB — URINALYSIS, MICROSCOPIC (REFLEX)

## 2022-09-27 LAB — URINALYSIS, ROUTINE W REFLEX MICROSCOPIC
Bilirubin Urine: NEGATIVE
Glucose, UA: NEGATIVE mg/dL
Hgb urine dipstick: NEGATIVE
Ketones, ur: >=80 mg/dL — AB
Nitrite: NEGATIVE
Protein, ur: 100 mg/dL — AB
Specific Gravity, Urine: >=1.03 (ref 1.005–1.030)
pH: 6 (ref 5.0–8.0)

## 2022-09-27 MED ORDER — ONDANSETRON HCL 4 MG/2ML IJ SOLN
4.0000 mg | Freq: Once | INTRAMUSCULAR | Status: AC
  Administered 2022-09-27: 4 mg via INTRAVENOUS
  Filled 2022-09-27: qty 2

## 2022-09-27 MED ORDER — POTASSIUM CHLORIDE CRYS ER 20 MEQ PO TBCR: 20.0000 meq | EXTENDED_RELEASE_TABLET | Freq: Two times a day (BID) | ORAL | 0 refills | Status: AC

## 2022-09-27 MED ORDER — ONDANSETRON 8 MG PO TBDP: 8.0000 mg | ORAL_TABLET | Freq: Three times a day (TID) | ORAL | 0 refills | Status: AC | PRN

## 2022-09-27 MED ORDER — DEXTROSE 5 % IN LACTATED RINGERS IV BOLUS
1000.0000 mL | Freq: Once | INTRAVENOUS | Status: AC
  Administered 2022-09-27: 1000 mL via INTRAVENOUS

## 2022-09-27 NOTE — ED Notes (Signed)
Patient states feeling better, tolerated po fluids. Waiting for dispo

## 2022-09-27 NOTE — Discharge Instructions (Addendum)
Return if you are not able to hold liquids down at home.  This is especially important as persistent vomiting can affect your developing baby.

## 2022-09-27 NOTE — ED Provider Notes (Signed)
[name] Provider Note   CSN: [id number] Arrival date & time: 09/26/22  2138     History  Chief Complaint  Patient presents with   Emesis During Pregnancy    Cathy Todd is a 22 y.o. female.  The history is provided by the patient.  She has history of hypertension, asthma and is pregnant with last menstrual period 07/23/2022 and comes in with 2 weeks of nausea and vomiting.  She had nausea prior to that and had been taking promethazine which had kept the nausea under control.  The prescription ran out and it was 4 days before the prescription was able to be refilled and since then, she has not been able to hold anything down.  She states that she vomits shortly after taking the pills.  She was given a prescription for an alternate nausea medicine yesterday, Compazine, but that also has been ineffective.  She is gravida 2, para 0 with 1 prior miscarriage.  There have been no other problems with this pregnancy.   Home Medications Prior to Admission medications   Medication Sig Start Date End Date Taking? Authorizing Provider  doxycycline (VIBRAMYCIN) 100 MG capsule Take 1 capsule (100 mg total) by mouth 2 (two) times daily. One po bid x 7 days 12/17/20   Drenda Freeze, MD  lidocaine (XYLOCAINE) 2 % solution Use as directed 15 mLs in the mouth or throat as needed for mouth pain. 05/02/20   Sherol Dade E, PA-C      Allergies    Fish allergy, Peanut oil, Peanut-containing drug products, and Shellfish allergy    Review of Systems   Review of Systems  All other systems reviewed and are negative.   Physical Exam Updated Vital Signs BP 134/87 (BP Location: Right Arm)   Pulse 94   Temp 97.7 F (36.5 C) (Oral)   Resp 16   Ht 5\' 4"  (1.626 m)   Wt 115.7 kg   LMP 07/23/2022 (Exact Date) Comment: approx 9 week gest per pt  SpO2 100%   BMI 43.78 kg/m  Physical Exam Vitals and nursing note reviewed.   22 year old female, resting  comfortably and in no acute distress. Vital signs are normal. Oxygen saturation is 100%, which is normal. Head is normocephalic and atraumatic. PERRLA, EOMI. Oropharynx is clear. Neck is nontender and supple without adenopathy or JVD. Back is nontender and there is no CVA tenderness. Lungs are clear without rales, wheezes, or rhonchi. Chest is nontender. Heart has regular rate and rhythm without murmur. Abdomen is soft, flat, nontender.  Peristalsis is hypoactive. Extremities have no cyanosis or edema, full range of motion is present. Skin is warm and dry without rash. Neurologic: Mental status is normal, cranial nerves are intact, moves all extremities equally.  ED Results / Procedures / Treatments   Labs (all labs ordered are listed, but only abnormal results are displayed) Labs Reviewed  CBC WITH DIFFERENTIAL/PLATELET - Abnormal; Notable for the following components:      Result Value   WBC 11.3 (*)    Platelets 519 (*)    All other components within normal limits  COMPREHENSIVE METABOLIC PANEL - Abnormal; Notable for the following components:   Sodium 133 (*)    Potassium 3.2 (*)    Glucose, Bld 102 (*)    Total Protein 8.9 (*)    All other components within normal limits  HCG, QUANTITATIVE, PREGNANCY - Abnormal; Notable for the following components:   hCG, Beta Chain, Quant,  S 134,059 (*)    All other components within normal limits   Procedures Procedures    Medications Ordered in ED Medications  ondansetron (ZOFRAN) injection 4 mg (4 mg Intravenous Given 09/27/22 0100)  dextrose 5% lactated ringers bolus 1,000 mL (0 mLs Intravenous Stopped 09/27/22 0323)    ED Course/ Medical Decision Making/ A&P                             Medical Decision Making Amount and/or Complexity of Data Reviewed Labs: ordered.  Risk Prescription drug management.   First trimester pregnancy with hyperemesis gravidarum.  Based on her last menstrual period, she would be 10 weeks 3 days  pregnant.  She did have an ultrasound done at a health system where I cannot see the results, patient states that she is [redacted] weeks pregnant.  I have reviewed and interpreted her laboratory tests, and my interpretation is mild hyponatremia and hypokalemia which are likely secondary to multiple episodes of emesis, mildly elevated random glucose which will need to be watched closely and the patient was pregnant, hCG level of 134,000 which is appropriate for her stage of pregnancy, mild leukocytosis which is normal in pregnancy, thrombocytosis which is likely reactive.  At this point, I suspect that her main problem with promethazine and prochlorperazine is that she is vomiting before the actually have a chance to be absorbed.  I have ordered urinalysis to look for evidence of ketonuria and I have ordered IV fluids and IV ondansetron.  She feels much better following above-noted treatment, and has tolerated oral fluid challenge.  I am discharging her with prescription for ondansetron oral dissolving tablet as well as short course of oral potassium to replete her potassium stores.  She has her intake visit with her obstetrician later today and she is to keep that appointment.  Final Clinical Impression(s) / ED Diagnoses Final diagnoses:  Nausea and vomiting during pregnancy prior to [redacted] weeks gestation  Hyponatremia  Hypokalemia    Rx / DC Orders ED Discharge Orders          Ordered    ondansetron (ZOFRAN-ODT) 8 MG disintegrating tablet  Every 8 hours PRN        09/27/22 0338    potassium chloride SA (KLOR-CON M) 20 MEQ tablet  2 times daily        09/27/22 7341              Delora Fuel, MD 93/79/02 [phone]

## 2023-06-06 ENCOUNTER — Emergency Department (HOSPITAL_BASED_OUTPATIENT_CLINIC_OR_DEPARTMENT_OTHER)
Admission: EM | Admit: 2023-06-06 | Discharge: 2023-06-07 | Disposition: A | Discharge status: Home / Self Care | Payer: Medicaid Other | Attending: Emergency Medicine | Admitting: Emergency Medicine

## 2023-06-06 ENCOUNTER — Encounter (HOSPITAL_BASED_OUTPATIENT_CLINIC_OR_DEPARTMENT_OTHER): Payer: Self-pay

## 2023-06-06 DIAGNOSIS — R519 Headache, unspecified: Secondary | ICD-10-CM | POA: Diagnosis present

## 2023-06-06 DIAGNOSIS — H538 Other visual disturbances: Secondary | ICD-10-CM | POA: Insufficient documentation

## 2023-06-06 DIAGNOSIS — Z91010 Allergy to peanuts: Secondary | ICD-10-CM | POA: Diagnosis not present

## 2023-06-06 DIAGNOSIS — I10 Essential (primary) hypertension: Secondary | ICD-10-CM | POA: Insufficient documentation

## 2023-06-06 NOTE — ED Triage Notes (Signed)
Pt had C-section in August and reports she was dx with preeclampsia. Pt noted since then her BP's have been very high and she is feeling tingling in tongue. Pt has been in and out of hospitals per her reports and feels she is not being taken seriously. Pt states that at 9:30 pm her BP was 124/84. Pt taking labetalol and nifedipine. Pt also endorses HA x3 days that is frontal and radiates around to back. Pt endorses blurred vision that began 2 hrs ago.

## 2023-06-07 ENCOUNTER — Other Ambulatory Visit: Payer: Self-pay

## 2023-06-07 LAB — CBG MONITORING, ED: Glucose-Capillary: 97 mg/dL (ref 70–99)

## 2023-06-07 MED ORDER — SODIUM CHLORIDE 0.9 % IV BOLUS
1000.0000 mL | Freq: Once | INTRAVENOUS | Status: AC
  Administered 2023-06-07: 1000 mL via INTRAVENOUS

## 2023-06-07 MED ORDER — DROPERIDOL 2.5 MG/ML IJ SOLN
1.2500 mg | Freq: Once | INTRAMUSCULAR | Status: AC
  Administered 2023-06-07: 1.25 mg via INTRAVENOUS
  Filled 2023-06-07: qty 2

## 2023-06-07 MED ORDER — KETOROLAC TROMETHAMINE 15 MG/ML IJ SOLN
15.0000 mg | Freq: Once | INTRAMUSCULAR | Status: AC
  Administered 2023-06-07: 15 mg via INTRAVENOUS
  Filled 2023-06-07: qty 1

## 2023-06-07 MED ORDER — METOCLOPRAMIDE HCL 5 MG/ML IJ SOLN
10.0000 mg | Freq: Once | INTRAMUSCULAR | Status: AC
  Administered 2023-06-07: 10 mg via INTRAVENOUS
  Filled 2023-06-07: qty 2

## 2023-06-07 MED ORDER — DIPHENHYDRAMINE HCL 50 MG/ML IJ SOLN
12.5000 mg | Freq: Once | INTRAMUSCULAR | Status: AC
  Administered 2023-06-07: 12.5 mg via INTRAVENOUS
  Filled 2023-06-07: qty 1

## 2023-06-07 NOTE — ED Notes (Signed)
Patient able to ambulate in hallway unassisted. Pt stable on her feet while ambulating. Pt denies any symptoms.

## 2023-06-07 NOTE — ED Provider Notes (Signed)
[place] [facility] Provider Note   CSN: [id number] Arrival date & time: 06/06/23  2253     History  Chief Complaint  Patient presents with   Hypertension    Cathy Todd is a 22 y.o. female.  The history is provided by the patient.  Hypertension  Cathy Todd is a 22 y.o. female who presents to the Emergency Department complaining of headache.  She presents to the emergency department for evaluation of 3 days of headache located in the front and back of her head.  Headache is constant with occasional blurry vision.  No associated fevers, nausea, vomiting, diarrhea, numbness, weakness.  She is postpartum following cesarean delivery on August 14.  Her pregnancy was complicated by postpartum preeclampsia and she is now on labetalol and nifedipine.  She did check her blood pressure yesterday and it was 174/87, today it was normal.  She has had multiple ED visits for her elevated blood pressures.  Headache is new over the last few days.  She is bottlefeeding.  No additional medical problems.       Home Medications Prior to Admission medications   Medication Sig Start Date End Date Taking? Authorizing Provider  lidocaine (XYLOCAINE) 2 % solution Use as directed 15 mLs in the mouth or throat as needed for mouth pain. 05/02/20   Walisiewicz, Yvonna Alanis E, PA-C  ondansetron (ZOFRAN-ODT) 8 MG disintegrating tablet Take 1 tablet (8 mg total) by mouth every 8 (eight) hours as needed for nausea or vomiting. 09/27/22   Dione Booze, MD  potassium chloride SA (KLOR-CON M) 20 MEQ tablet Take 1 tablet (20 mEq total) by mouth 2 (two) times daily. 09/27/22   Dione Booze, MD      Allergies    Fish allergy, Peanut oil, Peanut-containing drug products, and Shellfish allergy    Review of Systems   Review of Systems  All other systems reviewed and are negative.   Physical Exam Updated Vital Signs BP 107/64   Pulse 88   Temp 98.1 F (36.7 C) (Oral)   Resp  16   Ht 5\' 4"  (1.626 m)   Wt 115.7 kg   LMP  (LMP Unknown) Comment: approx 9 week gest per pt  SpO2 100%   Breastfeeding Unknown   BMI 43.78 kg/m  Physical Exam Vitals and nursing note reviewed.  Constitutional:      Appearance: She is well-developed.  HENT:     Head: Normocephalic and atraumatic.  Cardiovascular:     Rate and Rhythm: Normal rate and regular rhythm.     Heart sounds: No murmur heard. Pulmonary:     Effort: Pulmonary effort is normal. No respiratory distress.     Breath sounds: Normal breath sounds.  Abdominal:     Palpations: Abdomen is soft.     Tenderness: There is no abdominal tenderness. There is no guarding or rebound.  Musculoskeletal:        General: No tenderness.     Cervical back: Neck supple.  Skin:    General: Skin is warm and dry.  Neurological:     Mental Status: She is alert and oriented to person, place, and time.     Comments: No asymmetry of facial movements.  Visual fields grossly intact.  5 out of 5 strength in all 4 extremities with sensation to light touch intact in all 4 extremities.  Psychiatric:        Behavior: Behavior normal.     ED Results / Procedures /  Treatments   Labs (all labs ordered are listed, but only abnormal results are displayed) Labs Reviewed  CBG MONITORING, ED    EKG None  Radiology No results found.  Procedures Procedures    Medications Ordered in ED Medications  droperidol (INAPSINE) 2.5 MG/ML injection 1.25 mg (1.25 mg Intravenous Given 06/07/23 0051)  sodium chloride 0.9 % bolus 1,000 mL (0 mLs Intravenous Stopped 06/07/23 0222)  ketorolac (TORADOL) 15 MG/ML injection 15 mg (15 mg Intravenous Given 06/07/23 0131)  metoCLOPramide (REGLAN) injection 10 mg (10 mg Intravenous Given 06/07/23 0132)  diphenhydrAMINE (BENADRYL) injection 12.5 mg (12.5 mg Intravenous Given 06/07/23 0132)    ED Course/ Medical Decision Making/ A&P                                 Medical Decision  Making Risk Prescription drug management.   Patient with recent diagnosis of postpartum preeclampsia on antihypertensives here for evaluation of 3 days of headache.  She is nontoxic-appearing on evaluation with no focal neurologic deficits.  She did recently have a CT venogram on August 14, which was unremarkable.  Current clinical picture is not consistent with subarachnoid hemorrhage, space-occupying lesion, dural sinus thrombosis.  She was treated with medications in the emergency department with improvement in her symptoms.  Of note she has not had any significant hypertension during her ED stay.  Discussed with patient home care for headache, recommendation for outpatient follow-up.        Final Clinical Impression(s) / ED Diagnoses Final diagnoses:  Bad headache    Rx / DC Orders ED Discharge Orders     None         Tilden Fossa, MD 06/07/23 [phone]

## 2023-06-14 ENCOUNTER — Emergency Department (HOSPITAL_COMMUNITY)
Admission: EM | Admit: 2023-06-14 | Discharge: 2023-06-14 | Discharge status: Against Medical Advice | Payer: Medicaid Other | Attending: Emergency Medicine | Admitting: Emergency Medicine

## 2023-06-14 ENCOUNTER — Other Ambulatory Visit: Payer: Self-pay

## 2023-06-14 DIAGNOSIS — I10 Essential (primary) hypertension: Secondary | ICD-10-CM | POA: Insufficient documentation

## 2023-06-14 DIAGNOSIS — R519 Headache, unspecified: Secondary | ICD-10-CM | POA: Diagnosis present

## 2023-06-14 DIAGNOSIS — Z5321 Procedure and treatment not carried out due to patient leaving prior to being seen by health care provider: Secondary | ICD-10-CM | POA: Insufficient documentation

## 2023-06-14 NOTE — ED Notes (Signed)
Pt called for room no answer and not seen in the lobby.

## 2023-06-14 NOTE — ED Triage Notes (Signed)
Patient reports elevated blood pressure at home this evening BP= 174/118 , mild headache , she has not missed any dose of her antihypertension medication .

## 2023-11-21 ENCOUNTER — Other Ambulatory Visit: Payer: Self-pay

## 2023-11-21 ENCOUNTER — Emergency Department (HOSPITAL_BASED_OUTPATIENT_CLINIC_OR_DEPARTMENT_OTHER)
Admission: EM | Admit: 2023-11-21 | Discharge: 2023-11-21 | Discharge status: Against Medical Advice | Attending: Emergency Medicine | Admitting: Emergency Medicine

## 2023-11-21 ENCOUNTER — Encounter (HOSPITAL_BASED_OUTPATIENT_CLINIC_OR_DEPARTMENT_OTHER): Payer: Self-pay | Admitting: Emergency Medicine

## 2023-11-21 DIAGNOSIS — Z5321 Procedure and treatment not carried out due to patient leaving prior to being seen by health care provider: Secondary | ICD-10-CM | POA: Insufficient documentation

## 2023-11-21 DIAGNOSIS — K0889 Other specified disorders of teeth and supporting structures: Secondary | ICD-10-CM | POA: Diagnosis present

## 2023-11-21 NOTE — ED Triage Notes (Signed)
 Left upper dental pain since yesterday. Reports having a tooth removed in same area last week. States there is a "hole" where the tooth was and she can "see the bone" Concerned for dry socket.
